# Patient Record
Sex: Female | Born: 1970 | Race: Asian | Hispanic: No | Marital: Married | Smoking: Never smoker
Health system: Southern US, Community
[De-identification: ages and names within clinical notes are randomized; demographics above are authoritative.]

## PROBLEM LIST (undated history)

## (undated) DIAGNOSIS — Z1211 Encounter for screening for malignant neoplasm of colon: Secondary | ICD-10-CM

## (undated) DIAGNOSIS — S83249A Other tear of medial meniscus, current injury, unspecified knee, initial encounter: Secondary | ICD-10-CM

## (undated) DIAGNOSIS — M1711 Unilateral primary osteoarthritis, right knee: Secondary | ICD-10-CM

## (undated) DIAGNOSIS — E282 Polycystic ovarian syndrome: Secondary | ICD-10-CM

## (undated) DIAGNOSIS — D649 Anemia, unspecified: Secondary | ICD-10-CM

## (undated) DIAGNOSIS — T8859XA Other complications of anesthesia, initial encounter: Secondary | ICD-10-CM

## (undated) DIAGNOSIS — H8120 Vestibular neuronitis, unspecified ear: Secondary | ICD-10-CM

## (undated) DIAGNOSIS — E559 Vitamin D deficiency, unspecified: Secondary | ICD-10-CM

## (undated) DIAGNOSIS — G5600 Carpal tunnel syndrome, unspecified upper limb: Secondary | ICD-10-CM

## (undated) DIAGNOSIS — Z87828 Personal history of other (healed) physical injury and trauma: Secondary | ICD-10-CM

## (undated) DIAGNOSIS — E785 Hyperlipidemia, unspecified: Secondary | ICD-10-CM

## (undated) HISTORY — DX: Hyperlipidemia, unspecified: E78.5

## (undated) HISTORY — DX: Vestibular neuronitis, unspecified ear: H81.20

## (undated) HISTORY — DX: Carpal tunnel syndrome, unspecified upper limb: G56.00

## (undated) HISTORY — DX: Other tear of medial meniscus, current injury, unspecified knee, initial encounter: S83.249A

## (undated) HISTORY — DX: Polycystic ovarian syndrome: E28.2

## (undated) HISTORY — DX: Anemia, unspecified: D64.9

## (undated) HISTORY — DX: Unilateral primary osteoarthritis, right knee: M17.11

## (undated) HISTORY — PX: OTHER SURGICAL HISTORY: SHX169

## (undated) HISTORY — DX: Vitamin D deficiency, unspecified: E55.9

## (undated) HISTORY — DX: Personal history of other (healed) physical injury and trauma: Z87.828

## (undated) HISTORY — DX: Encounter for screening for malignant neoplasm of colon: Z12.11

---

## 2004-12-06 ENCOUNTER — Ambulatory Visit: Payer: Self-pay | Admitting: Internal Medicine

## 2004-12-13 ENCOUNTER — Ambulatory Visit: Payer: Self-pay | Admitting: Internal Medicine

## 2004-12-13 ENCOUNTER — Other Ambulatory Visit: Admission: RE | Admit: 2004-12-13 | Discharge: 2004-12-13 | Payer: Self-pay | Admitting: Internal Medicine

## 2005-02-19 ENCOUNTER — Ambulatory Visit: Payer: Self-pay | Admitting: Family Medicine

## 2005-08-24 ENCOUNTER — Ambulatory Visit: Payer: Self-pay | Admitting: Family Medicine

## 2006-04-17 ENCOUNTER — Ambulatory Visit: Payer: Self-pay | Admitting: Internal Medicine

## 2006-12-12 ENCOUNTER — Ambulatory Visit: Payer: Self-pay | Admitting: Internal Medicine

## 2007-03-13 ENCOUNTER — Other Ambulatory Visit: Admission: RE | Admit: 2007-03-13 | Discharge: 2007-03-13 | Payer: Self-pay | Admitting: Internal Medicine

## 2007-03-13 ENCOUNTER — Ambulatory Visit: Payer: Self-pay | Admitting: Internal Medicine

## 2007-03-13 ENCOUNTER — Encounter: Payer: Self-pay | Admitting: Internal Medicine

## 2007-03-13 LAB — CONVERTED CEMR LAB
Albumin: 3.8 g/dL (ref 3.5–5.2)
Basophils Absolute: 0 10*3/uL (ref 0.0–0.1)
Chloride: 109 meq/L (ref 96–112)
Cholesterol: 184 mg/dL (ref 0–200)
Eosinophils Absolute: 0.2 10*3/uL (ref 0.0–0.6)
GFR calc Af Amer: 124 mL/min
GFR calc non Af Amer: 102 mL/min
HCT: 39.7 % (ref 36.0–46.0)
MCHC: 34.7 g/dL (ref 30.0–36.0)
MCV: 85.7 fL (ref 78.0–100.0)
Monocytes Absolute: 0.6 10*3/uL (ref 0.2–0.7)
Neutrophils Relative %: 52.7 % (ref 43.0–77.0)
Potassium: 3.8 meq/L (ref 3.5–5.1)
RBC: 4.63 M/uL (ref 3.87–5.11)
Sodium: 141 meq/L (ref 135–145)
TSH: 1.17 microintl units/mL (ref 0.35–5.50)
Total CHOL/HDL Ratio: 4

## 2007-07-17 ENCOUNTER — Telehealth: Payer: Self-pay | Admitting: *Deleted

## 2007-11-05 ENCOUNTER — Inpatient Hospital Stay (HOSPITAL_COMMUNITY): Admission: AD | Admit: 2007-11-05 | Discharge: 2007-11-05 | Payer: Self-pay | Admitting: Obstetrics and Gynecology

## 2007-12-06 ENCOUNTER — Inpatient Hospital Stay (HOSPITAL_COMMUNITY): Admission: AD | Admit: 2007-12-06 | Discharge: 2007-12-06 | Payer: Self-pay | Admitting: Obstetrics and Gynecology

## 2008-01-14 ENCOUNTER — Telehealth (INDEPENDENT_AMBULATORY_CARE_PROVIDER_SITE_OTHER): Payer: Self-pay | Admitting: *Deleted

## 2008-01-15 DIAGNOSIS — M79609 Pain in unspecified limb: Secondary | ICD-10-CM

## 2008-01-15 DIAGNOSIS — R209 Unspecified disturbances of skin sensation: Secondary | ICD-10-CM | POA: Insufficient documentation

## 2008-01-23 ENCOUNTER — Telehealth: Payer: Self-pay | Admitting: Internal Medicine

## 2008-05-15 ENCOUNTER — Ambulatory Visit: Payer: Self-pay | Admitting: Vascular Surgery

## 2008-05-15 ENCOUNTER — Encounter (INDEPENDENT_AMBULATORY_CARE_PROVIDER_SITE_OTHER): Payer: Self-pay | Admitting: Obstetrics and Gynecology

## 2008-05-15 ENCOUNTER — Ambulatory Visit (HOSPITAL_COMMUNITY): Admission: RE | Admit: 2008-05-15 | Discharge: 2008-05-15 | Payer: Self-pay | Admitting: Obstetrics and Gynecology

## 2008-08-05 ENCOUNTER — Inpatient Hospital Stay (HOSPITAL_COMMUNITY): Admission: AD | Admit: 2008-08-05 | Discharge: 2008-08-05 | Payer: Self-pay | Admitting: Obstetrics and Gynecology

## 2008-08-20 ENCOUNTER — Inpatient Hospital Stay (HOSPITAL_COMMUNITY): Admission: AD | Admit: 2008-08-20 | Discharge: 2008-08-23 | Payer: Self-pay | Admitting: Obstetrics & Gynecology

## 2008-08-20 ENCOUNTER — Inpatient Hospital Stay (HOSPITAL_COMMUNITY): Admission: AD | Admit: 2008-08-20 | Discharge: 2008-08-20 | Payer: Self-pay | Admitting: Obstetrics and Gynecology

## 2008-11-01 ENCOUNTER — Encounter: Admission: RE | Admit: 2008-11-01 | Discharge: 2008-11-01 | Payer: Self-pay | Admitting: Orthopedic Surgery

## 2009-01-28 ENCOUNTER — Telehealth: Payer: Self-pay | Admitting: Internal Medicine

## 2009-04-10 ENCOUNTER — Ambulatory Visit: Payer: Self-pay | Admitting: Internal Medicine

## 2009-04-10 DIAGNOSIS — J309 Allergic rhinitis, unspecified: Secondary | ICD-10-CM | POA: Insufficient documentation

## 2009-04-10 DIAGNOSIS — R42 Dizziness and giddiness: Secondary | ICD-10-CM

## 2009-04-10 LAB — CONVERTED CEMR LAB: Blood Glucose, Fingerstick: 86

## 2009-04-13 LAB — CONVERTED CEMR LAB
ALT: 19 units/L (ref 0–35)
Albumin: 4.2 g/dL (ref 3.5–5.2)
Basophils Absolute: 0 10*3/uL (ref 0.0–0.1)
Bilirubin, Direct: <0.1 mg/dL (ref 0.0–0.3)
CO2: 24 meq/L (ref 19–32)
Cholesterol: 188 mg/dL (ref 0–200)
Glucose, Bld: 86 mg/dL (ref 70–99)
HDL: 44 mg/dL (ref >39)
Hemoglobin: 12.9 g/dL (ref 12.0–15.0)
Lymphocytes Relative: 45 % (ref 12–46)
Monocytes Absolute: 0.7 10*3/uL (ref 0.1–1.0)
Monocytes Relative: 8 % (ref 3–12)
Neutro Abs: 3.9 10*3/uL (ref 1.7–7.7)
Potassium: 4.7 meq/L (ref 3.5–5.3)
RBC: 4.66 M/uL (ref 3.87–5.11)
Sodium: 139 meq/L (ref 135–145)
TSH: 1.296 microintl units/mL (ref 0.350–4.500)
Total CHOL/HDL Ratio: 4.3
VLDL: 38 mg/dL (ref 0–40)
Vitamin B-12: 728 pg/mL (ref 211–911)

## 2010-09-28 LAB — HM PAP SMEAR

## 2011-04-12 NOTE — Discharge Summary (Signed)
Katie Beck, Katie Beck NO.:  [id number]   MEDICAL RECORD NO.:  [id number]          PATIENT TYPE:  INP   LOCATION:  9126                          FACILITY:  [facility]   PHYSICIAN:  Guy Sandifer. Henderson Cloud, M.D. DATE OF BIRTH:  09/26/1971   DATE OF ADMISSION:  08/20/2008  DATE OF DISCHARGE:  08/23/2008                               DISCHARGE SUMMARY   ADMITTING DIAGNOSES:  1. Intrauterine pregnancy at 38-1/2 weeks estimated gestational age.  2. Previous cesarean section with vertical uterine scar.  3. Labor.   DISCHARGE DIAGNOSES:  1. Intrauterine pregnancy at 38-1/2 weeks estimated gestational age.  2. Previous cesarean section with vertical uterine scar.  3. Labor.   PROCEDURE:  On August 20, 2008, is repeat low-transverse cesarean  section.   REASON FOR ADMISSION:  The patient is a 40 year old married female G3,  P1, at 38-1/2 weeks.  Previous delivery was marked with a cesarean  section with vertical extension.  She presents with contractions.   HOSPITAL COURSE:  The patient was admitted to the hospital, undergoes  the above procedure for a viable female infant Apgars of 9 and 9.  Postoperatively, she has vital signs that remained stable and afebrile.  She has a good resumption of bowel function and ambulation.  Hemoglobin  is 11.7.   CONDITION ON DISCHARGE:  Good.  Diet regular as tolerated.  Activity, no  lifting, no operation of automobiles, no vaginal entry.  She is to call  the office for problems including not limited to temperature of 101  degrees, persistent nausea, vomiting, heavy vaginal bleeding, or  increasing pain.  Medications,  1. Percocet 5/325 mg #40 1-2 p.o. q.6 h. P.r.n.  2. Ibuprofen 6 mg q.6 h. p.r.n.  3. Prenatal vitamins daily.   Followup is in the office in 2 weeks.      Guy Sandifer Henderson Cloud, M.D.  Electronically Signed     JET/MEDQ  D:  08/23/2008  T:  08/23/2008  Job:  [id number]

## 2011-04-12 NOTE — Op Note (Signed)
Katie Beck, Katie Beck NO.:  [id number]   MEDICAL RECORD NO.:  [id number]          PATIENT TYPE:  INP   LOCATION:  9126                          FACILITY:  [facility]   PHYSICIAN:  Freddy Finner, M.D.   DATE OF BIRTH:  [birth year]   DATE OF PROCEDURE:  08/20/2008  DATE OF DISCHARGE:                               OPERATIVE REPORT   PREOPERATIVE DIAGNOSES:  1. Intrauterine pregnancy at 38-1/[redacted] weeks gestation.  2. Surgically scarred uterus.  3. Cesarean delivery.  4. In labor.   POSTOPERATIVE DIAGNOSES:  1. Intrauterine pregnancy at 38-1/[redacted] weeks gestation.  2. Surgically scarred uterus.  3. Cesarean delivery.  4. In labor.  5. Subserosal and intramural leiomyomata.   OPERATION/PROCEDURE:  Repeat low transverse cervical cesarean section.   SURGEON:  Freddy Finner, M.D.   ANESTHESIA:  Spinal.   ESTIMATED BLOOD LOSS:  Less than 600 mL.   INTRAOPERATIVE COMPLICATIONS:  None.   INDICATIONS:  The patient is a 40 year old who has had an uneventful  pregnancy and negative beta strep screening who presented in labor. Her  original cesarean delivery was to be October 1.  She had been seen twice  in the MAU on the day of surgery and initially her contractions stopped  with subcutaneous terbutaline.  She presented again this evening with  recurring contractions and documented cervical change.   DESCRIPTION OF PROCEDURE:  She was brought to the operating room.  She  was given an IV bolus of Ancef. She was placed under spinal anesthesia.  She was placed in the dorsal recumbent position with elevation of the  right hip.  The abdomen was prepped and draped in the usual fashion.  Foley catheter was placed using sterile technique.  A lower abdominal  transverse incision was made through an old scar and carried sharply  down to the fascia.  The fascia was entered sharply and extended to the  extent of the skin incision.  Rectus sheath was developed superiorly and  inferiorly  with blunt and sharp dissection.  Rectus muscle was divided  in the midline.  Peritoneum was entered sharply and extended bluntly and  sharply to the extent of the skin incision.  Bladder blade was placed.  Transverse incision was made in the vesicouterine peritoneum overlying  the lower uterine segment.  Trace of fresh meconium was noted in the  fluid.  The infant was then delivered without difficulty.  Bulb  suctioning was accomplished before delivery of the shoulders.  There was  a shoulder cord that reduced as the infant was delivered.  Apgars were 9  and 9, assigned by NICU in attendance.  Cord blood was obtained for  routine venous sampling and for arterial cord blood gases.  Gases are  pending.  Apgars were 9 and 9 by NICU.  The placenta and other products  of conception were removed from the uterus. The uterus was delivered  onto the anterior abdominal wall.  The tubes and ovaries were normal.  Small fibroids were  noted as mentioned above.  After carefully manually  exploring the uterine cavity and all products  of conception being  removed, the uterine incision was closed with double layer of running  locking 0 Monocryls used for the first layer and an imbricating suture  of 0 Monocryl for the second.  Figure-of-eight was required at the right  margin for complete hemostasis.  Bladder flap was reapproximated with an  interrupted 0 Monocryl suture.  Uterus was delivered back into the  abdominal cavity.  Irrigation was carried out.  Hemostasis was again  confirmed to be complete on the lower uterine incision.  All pack,  needle and instrument counts were correct.  Abdominal incision was  closed in layers.  Running 0 Monocryl was used to close the peritoneum  and reapproximate the rectus muscles.  Fascia was closed with a little  looped 0 PDS running from angle to angle on either side.  Subcutaneous  tissue was approximated with running 2-0 plain.  Skin was closed with  wide skin  staples and quarter-inch Steri-Strips.  The patient tolerated  the operative procedure well and was taken to the recovery room in good  condition.      Freddy Finner, M.D.  Electronically Signed     WRN/MEDQ  D:  08/20/2008  T:  08/21/2008  Job:  [id number]

## 2011-08-29 LAB — CBC
HCT: 33.8 — ABNORMAL LOW
MCHC: 33.9
MCV: 89.4
Platelets: 248
Platelets: 313
RBC: 4.53
RDW: 14.2
RDW: 14.2
WBC: 11.1 — ABNORMAL HIGH

## 2011-08-29 LAB — RPR: RPR Ser Ql: NONREACTIVE

## 2011-08-31 LAB — CBC
HCT: 37.9
Hemoglobin: 12.8
MCHC: 33.6
MCV: 89.7
Platelets: 303
RBC: 4.23
RDW: 14
WBC: 9.9

## 2011-08-31 LAB — URINALYSIS, ROUTINE W REFLEX MICROSCOPIC
Bilirubin Urine: NEGATIVE
Glucose, UA: NEGATIVE
Hgb urine dipstick: NEGATIVE
Ketones, ur: NEGATIVE
Nitrite: NEGATIVE
Protein, ur: NEGATIVE
Specific Gravity, Urine: <1.005 — ABNORMAL LOW
Urobilinogen, UA: 0.2
pH: 7

## 2011-09-12 ENCOUNTER — Telehealth: Payer: Self-pay | Admitting: Internal Medicine

## 2011-09-12 NOTE — Telephone Encounter (Signed)
Patient would like to switch from [facility] to [facility] for [facility], is this okay?

## 2011-09-12 NOTE — Telephone Encounter (Signed)
Happy to see her just send a note to previous pmd to make sure that is ok with them

## 2011-09-13 NOTE — Telephone Encounter (Signed)
Ok  With me  To change sites of pcp. wdp.

## 2011-09-13 NOTE — Telephone Encounter (Signed)
Didn't know if you saw this.

## 2011-09-14 NOTE — Telephone Encounter (Signed)
Patient has been scheduled

## 2011-10-07 ENCOUNTER — Ambulatory Visit: Payer: Self-pay | Admitting: Family Medicine

## 2011-10-07 DIAGNOSIS — Z0289 Encounter for other administrative examinations: Secondary | ICD-10-CM

## 2011-11-23 ENCOUNTER — Encounter: Payer: Self-pay | Admitting: Family Medicine

## 2011-11-23 ENCOUNTER — Ambulatory Visit (INDEPENDENT_AMBULATORY_CARE_PROVIDER_SITE_OTHER): Payer: 59 | Admitting: Family Medicine

## 2011-11-23 VITALS — BP 127/84 | HR 106 | Temp 100.4°F | Ht 62.0 in | Wt 125.0 lb

## 2011-11-23 DIAGNOSIS — R6889 Other general symptoms and signs: Secondary | ICD-10-CM

## 2011-11-23 MED ORDER — HYDROCOD POLST-CPM POLST ER 10-8 MG PO CP12: 1.0000 | ORAL_CAPSULE | Freq: Two times a day (BID) | ORAL | Status: DC | PRN

## 2011-11-23 NOTE — Progress Notes (Signed)
OFFICE NOTE  11/23/2011  CC:  Chief Complaint  Patient presents with  . Establish Care    fever, congestion, HA, ST, cough, naueas     HPI: Patient is a 40 y.o. [place] female who is here for cough.  She has been seeing Dr. Fabian Sharp at [facility] but is switching to Dr. Abner Greenspan (seeing me today b/c Dr. Abner Greenspan is out). Pt reports onset of symptoms 36 hours ago.  Primary symptoms are: cough, fever, HA, nasal congestion, PND, ST, body aches.  Most prominent/bothersome symptom(s): f/c.  Symptoms made worse by movement.  Symptoms alleviated by rest..   GI sx's: no, except some nausea Prominent myalgias: yes Prominent fatigue: yes. Flu vaccine received this season at least 2 weeks ago: NO. Recent contact with person with flu-like illness: no.  Others in her family have had mild URI sx's.  ROS:  no rash, no neck stiffness, no shortness of breath, no chest pain    Pertinent PMH:  Past Medical History  Diagnosis Date  . CTS (carpal tunnel syndrome)   . PCO (polycystic ovaries)     conceived on metformin   Past Surgical History  Procedure Date  . Cesarean section 2009 &2002   Past family and social history reviewed and there are no changes since the patient's last office visit.  Pertinent Meds: Ibuprofen, OTC cold/flu formula  PE: Blood pressure 127/84, pulse 106, temperature 100.4 F (38 C), temperature source Temporal, height 5\' 2"  (1.575 m), weight 125 lb (56.7 kg), last menstrual period 11/07/2011, SpO2 99.00%. VS: noted--normal. Gen: alert, NAD, tired but NONTOXIC APPEARING. HEENT: eyes without injection, drainage, or swelling.  Ears: EACs clear, TMs with normal light reflex and landmarks.  Nose: Clear rhinorrhea, with some dried, crusty exudate adherent to mildly injected mucosa.  No purulent d/c.  No paranasal sinus TTP.  No facial swelling.  Throat and mouth without focal lesion.  No pharyngial swelling, erythema, or exudate.   Neck: supple, no LAD.   LUNGS: CTA bilat,  nonlabored resps.   CV: RRR, no m/r/g. EXT: no c/c/e SKIN: no rash    IMPRESSION AND PLAN: Flu-like illness. Tussicaps 1 q12h prn, #20, no RF. Ibuprofen 600-800mg  q6-8h prn fever. Rest, fluids.  Discussed tamiflu option, decided risks outweighed potential benefits for her so decided against this med today. Return when feeling well and we'll give flu vaccine.  FOLLOW UP: prn

## 2011-12-05 ENCOUNTER — Encounter: Payer: Self-pay | Admitting: Family Medicine

## 2011-12-05 ENCOUNTER — Ambulatory Visit (INDEPENDENT_AMBULATORY_CARE_PROVIDER_SITE_OTHER): Payer: 59 | Admitting: Family Medicine

## 2011-12-05 VITALS — BP 112/74 | HR 82 | Temp 98.0°F | Ht 62.0 in | Wt 123.1 lb

## 2011-12-05 DIAGNOSIS — R42 Dizziness and giddiness: Secondary | ICD-10-CM

## 2011-12-05 DIAGNOSIS — B349 Viral infection, unspecified: Secondary | ICD-10-CM

## 2011-12-05 DIAGNOSIS — Z23 Encounter for immunization: Secondary | ICD-10-CM

## 2011-12-05 DIAGNOSIS — B9789 Other viral agents as the cause of diseases classified elsewhere: Secondary | ICD-10-CM

## 2011-12-05 LAB — GLUCOSE, POCT (MANUAL RESULT ENTRY): POC Glucose: 105

## 2011-12-05 MED ORDER — TETANUS-DIPHTH-ACELL PERTUSSIS 5-2.5-18.5 LF-MCG/0.5 IM SUSP: 0.5000 mL | Freq: Once | INTRAMUSCULAR | Status: DC

## 2011-12-05 MED ORDER — METHYLPREDNISOLONE 4 MG PO KIT: PACK | ORAL | Status: AC

## 2011-12-05 NOTE — Patient Instructions (Addendum)
Vertigo Vertigo means you feel like you or your surroundings are moving when they are not. Vertigo can be dangerous if it occurs when you are at work, driving, or performing difficult activities.  CAUSES  Vertigo occurs when there is a conflict of signals sent to your brain from the visual and sensory systems in your body. There are many different causes of vertigo, including:  Infections, especially in the inner ear.   A bad reaction to a drug or misuse of alcohol and medicines.   Withdrawal from drugs or alcohol.   Rapidly changing positions, such as lying down or rolling over in bed.   A migraine headache.   Decreased blood flow to the brain.   Increased pressure in the brain from a head injury, infection, tumor, or bleeding.  SYMPTOMS  You may feel as though the world is spinning around or you are falling to the ground. Because your balance is upset, vertigo can cause nausea and vomiting. You may have involuntary eye movements (nystagmus). DIAGNOSIS  Vertigo is usually diagnosed by physical exam. If the cause of your vertigo is unknown, your caregiver may perform imaging tests, such as an MRI scan (magnetic resonance imaging). TREATMENT  Most cases of vertigo resolve on their own, without treatment. Depending on the cause, your caregiver may prescribe certain medicines. If your vertigo is related to body position issues, your caregiver may recommend movements or procedures to correct the problem. In rare cases, if your vertigo is caused by certain inner ear problems, you may need surgery. HOME CARE INSTRUCTIONS   Follow your caregiver's instructions.   Avoid driving.   Avoid operating heavy machinery.   Avoid performing any tasks that would be dangerous to you or others during a vertigo episode.   Tell your caregiver if you notice that certain medicines seem to be causing your vertigo. Some of the medicines used to treat vertigo episodes can actually make them worse in some  people.  SEEK IMMEDIATE MEDICAL CARE IF:   Your medicines do not relieve your vertigo or are making it worse.   You develop problems with talking, walking, weakness, or using your arms, hands, or legs.   You develop severe headaches.   Your nausea or vomiting continues or gets worse.   You develop visual changes.   A family member notices behavioral changes.   Your condition gets worse.  MAKE SURE YOU:  Understand these instructions.   Will watch your condition.   Will get help right away if you are not doing well or get worse.  Document Released: 08/24/2005 Document Revised: 07/27/2011 Document Reviewed: 06/02/2011 [facility] Patient Information 2012 [place], [place].  Increase hydration and one Gatorade daily for next week Only take medrol dose pak if vertigo worsens again

## 2011-12-11 NOTE — Progress Notes (Signed)
Patient ID: Katie Beck, female   DOB: 01/05/1971, 41 y.o.   MRN: [id number] Katie Beck [id number] 02-05-1971 12/11/2011      Progress Note-Follow Up  Subjective  Chief Complaint  Chief Complaint  Patient presents with  . Injections    flu shot  . Dizziness    X 3 days    HPI  Patient is a 41 year old female who has been struggling with congestion, facial pressure malaise and some pressure in ears. Has been present for the past day she consumes increased vertigo. She sustained sensation when she moves quickly. No nausea vomiting. No headache or other complaints. No chest pain, palpitations shortness of breath, syncope, GI or GU complaints noted.  Past Medical History  Diagnosis Date  . 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 Hyperlipidemia   . CTS (carpal tunnel syndrome)     Past Surgical History  Procedure Date  . Cesarean section 2009 &2002    Family History  Problem Relation Age of Onset  . Diabetes Father   . Hypertension Father   . Hypertension Sister   . Diabetes Sister     History   Social History  . Marital Status: Married    Spouse Name: N/A    Number of Children: N/A  . Years of Education: N/A   Occupational History  . Not on file.   Social History Main Topics  . Smoking status: Never Smoker   . Smokeless tobacco: Never Used  . Alcohol Use: No  . Drug Use: No  . Sexually Active: Not on file   Other Topics Concern  . 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      pets no     NO ets or tobacco.      Vegetarian   Smoking Status:  never   Caffeine use/day:  3-4   Does Patient Exercise:  no    No current outpatient prescriptions on file prior to visit.   No current facility-administered medications on file prior to visit.    No Known Allergies  Review of Systems  Review of Systems    Constitutional: Negative for fever and malaise/fatigue.  HENT: Negative for congestion.   Eyes: Negative for discharge.  Respiratory: Negative for shortness of breath.   Cardiovascular: Negative for chest pain, palpitations and leg swelling.  Gastrointestinal: Negative for nausea, abdominal pain and diarrhea.  Genitourinary: Negative for dysuria.  Musculoskeletal: Negative for falls.  Skin: Negative for rash.  Neurological: Positive for dizziness. Negative for sensory change, speech change, seizures, loss of consciousness and headaches.  Endo/Heme/Allergies: Negative for polydipsia.  Psychiatric/Behavioral: Negative for depression and suicidal ideas. The patient is not nervous/anxious and does not have insomnia.     Objective  BP 112/74  Pulse 82  Temp(Src) 98 F (36.7 C) (Temporal)  Ht 5\' 2"  (1.575 m)  Wt 123 lb 1.9 oz (55.847 kg)  BMI 22.52 kg/m2  SpO2 100%  LMP 12/05/2011  Physical Exam  Physical Exam  Constitutional: She is oriented to person, place, and time and well-developed, well-nourished, and in no distress. No distress.  HENT:  Head: Normocephalic and atraumatic.  Eyes: Conjunctivae are normal.  Neck: Neck supple. No thyromegaly present.  Cardiovascular: Normal rate, regular rhythm and normal heart sounds.   No murmur heard.  Pulmonary/Chest: Effort normal and breath sounds normal. She has no wheezes.  Abdominal: She exhibits no distension and no mass.  Musculoskeletal: She exhibits no edema.  Lymphadenopathy:    She has no cervical adenopathy.  Neurological: She is alert and oriented to person, place, and time.  Skin: Skin is warm and dry. No rash noted. She is not diaphoretic.  Psychiatric: Memory, affect and judgment normal.    Lab Results  Component Value Date   TSH 1.296 04/10/2009   Lab Results  Component Value Date   WBC 8.9 04/10/2009   HGB 12.9 04/10/2009   HCT 37.1 04/10/2009   MCV 79.6 04/10/2009   PLT 346 04/10/2009   Lab Results  Component  Value Date   CREATININE 0.70 04/10/2009   BUN 19 04/10/2009   NA 139 04/10/2009   K 4.7 04/10/2009   CL 106 04/10/2009   CO2 24 04/10/2009   Lab Results  Component Value Date   ALT 19 04/10/2009   AST 14 04/10/2009   ALKPHOS 88 04/10/2009   BILITOT 0.2* 04/10/2009   Lab Results  Component Value Date   CHOL 188 04/10/2009   Lab Results  Component Value Date   HDL 44 04/10/2009   Lab Results  Component Value Date   LDLCALC 106* 04/10/2009   Lab Results  Component Value Date   TRIG 191* 04/10/2009   Lab Results  Component Value Date   CHOLHDL 4.3 Ratio 04/10/2009     Assessment & Plan   VERTIGO Has suffered a recent viral illness that has resulted in ome inner ear dysfunction and vertigo, she is ge-iven a Medrol dose pack and asked to hydrate well and report if symptoms worsen or do not resolve

## 2011-12-16 NOTE — Assessment & Plan Note (Signed)
Has suffered a recent viral illness that has resulted in ome inner ear dysfunction and vertigo, she is ge-iven a Medrol dose pack and asked to hydrate well and report if symptoms worsen or do not resolve

## 2012-10-30 ENCOUNTER — Encounter: Payer: Self-pay | Admitting: Family Medicine

## 2012-10-30 ENCOUNTER — Ambulatory Visit (INDEPENDENT_AMBULATORY_CARE_PROVIDER_SITE_OTHER): Payer: 59 | Admitting: Family Medicine

## 2012-10-30 VITALS — BP 107/70 | HR 71 | Temp 98.2°F | Ht 62.0 in | Wt 128.0 lb

## 2012-10-30 DIAGNOSIS — E559 Vitamin D deficiency, unspecified: Secondary | ICD-10-CM

## 2012-10-30 DIAGNOSIS — Z0000 Encounter for general adult medical examination without abnormal findings: Secondary | ICD-10-CM

## 2012-10-30 DIAGNOSIS — D649 Anemia, unspecified: Secondary | ICD-10-CM | POA: Insufficient documentation

## 2012-10-30 DIAGNOSIS — E785 Hyperlipidemia, unspecified: Secondary | ICD-10-CM

## 2012-10-30 HISTORY — DX: Vitamin D deficiency, unspecified: E55.9

## 2012-10-30 HISTORY — DX: Anemia, unspecified: D64.9

## 2012-10-30 NOTE — Patient Instructions (Addendum)
Consider switching to MegaRed krill oil caps daily  Preventive Care for Adults, Female A healthy lifestyle and preventive care can promote health and wellness. Preventive health guidelines for women include the following key practices.  A routine yearly physical is a good way to check with your caregiver about your health and preventive screening. It is a chance to share any concerns and updates on your health, and to receive a thorough exam.  Visit your dentist for a routine exam and preventive care every 6 months. Brush your teeth twice a day and floss once a day. Good oral hygiene prevents tooth decay and gum disease.  The frequency of eye exams is based on your age, health, family medical history, use of contact lenses, and other factors. Follow your caregiver's recommendations for frequency of eye exams.  Eat a healthy diet. Foods like vegetables, fruits, whole grains, low-fat dairy products, and lean protein foods contain the nutrients you need without too many calories. Decrease your intake of foods high in solid fats, added sugars, and salt. Eat the right amount of calories for you.Get information about a proper diet from your caregiver, if necessary.  Regular physical exercise is one of the most important things you can do for your health. Most adults should get at least 150 minutes of moderate-intensity exercise (any activity that increases your heart rate and causes you to sweat) each week. In addition, most adults need muscle-strengthening exercises on 2 or more days a week.  Maintain a healthy weight. The body mass index (BMI) is a screening tool to identify possible weight problems. It provides an estimate of body fat based on height and weight. Your caregiver can help determine your BMI, and can help you achieve or maintain a healthy weight.For adults 20 years and older:  A BMI below 18.5 is considered underweight.  A BMI of 18.5 to 24.9 is normal.  A BMI of 25 to 29.9 is  considered overweight.  A BMI of 30 and above is considered obese.  Maintain normal blood lipids and cholesterol levels by exercising and minimizing your intake of saturated fat. Eat a balanced diet with plenty of fruit and vegetables. Blood tests for lipids and cholesterol should begin at age 53 and be repeated every 5 years. If your lipid or cholesterol levels are high, you are over 50, or you are at high risk for heart disease, you may need your cholesterol levels checked more frequently.Ongoing high lipid and cholesterol levels should be treated with medicines if diet and exercise are not effective.  If you smoke, find out from your caregiver how to quit. If you do not use tobacco, do not start.  If you are pregnant, do not drink alcohol. If you are breastfeeding, be very cautious about drinking alcohol. If you are not pregnant and choose to drink alcohol, do not exceed 1 drink per day. One drink is considered to be 12 ounces (355 mL) of beer, 5 ounces (148 mL) of wine, or 1.5 ounces (44 mL) of liquor.  Avoid use of street drugs. Do not share needles with anyone. Ask for help if you need support or instructions about stopping the use of drugs.  High blood pressure causes heart disease and increases the risk of stroke. Your blood pressure should be checked at least every 1 to 2 years. Ongoing high blood pressure should be treated with medicines if weight loss and exercise are not effective.  If you are 48 to 39 years old, ask your caregiver if  you should take aspirin to prevent strokes.  Diabetes screening involves taking a blood sample to check your fasting blood sugar level. This should be done once every 3 years, after age 29, if you are within normal weight and without risk factors for diabetes. Testing should be considered at a younger age or be carried out more frequently if you are overweight and have at least 1 risk factor for diabetes.  Breast cancer screening is essential preventive  care for women. You should practice "breast self-awareness." This means understanding the normal appearance and feel of your breasts and may include breast self-examination. Any changes detected, no matter how small, should be reported to a caregiver. Women in their 42s and 30s should have a clinical breast exam (CBE) by a caregiver as part of a regular health exam every 1 to 3 years. After age 55, women should have a CBE every year. Starting at age 23, women should consider having a mammography (breast X-ray test) every year. Women who have a family history of breast cancer should talk to their caregiver about genetic screening. Women at a high risk of breast cancer should talk to their caregivers about having magnetic resonance imaging (MRI) and a mammography every year.  The Pap test is a screening test for cervical cancer. A Pap test can show cell changes on the cervix that might become cervical cancer if left untreated. A Pap test is a procedure in which cells are obtained and examined from the lower end of the uterus (cervix).  Women should have a Pap test starting at age 50.  Between ages 3 and 56, Pap tests should be repeated every 2 years.  Beginning at age 59, you should have a Pap test every 3 years as long as the past 3 Pap tests have been normal.  Some women have medical problems that increase the chance of getting cervical cancer. Talk to your caregiver about these problems. It is especially important to talk to your caregiver if a new problem develops soon after your last Pap test. In these cases, your caregiver may recommend more frequent screening and Pap tests.  The above recommendations are the same for women who have or have not gotten the vaccine for human papillomavirus (HPV).  If you had a hysterectomy for a problem that was not cancer or a condition that could lead to cancer, then you no longer need Pap tests. Even if you no longer need a Pap test, a regular exam is a good idea  to make sure no other problems are starting.  If you are between ages 36 and 72, and you have had normal Pap tests going back 10 years, you no longer need Pap tests. Even if you no longer need a Pap test, a regular exam is a good idea to make sure no other problems are starting.  If you have had past treatment for cervical cancer or a condition that could lead to cancer, you need Pap tests and screening for cancer for at least 20 years after your treatment.  If Pap tests have been discontinued, risk factors (such as a new sexual partner) need to be reassessed to determine if screening should be resumed.  The HPV test is an additional test that may be used for cervical cancer screening. The HPV test looks for the virus that can cause the cell changes on the cervix. The cells collected during the Pap test can be tested for HPV. The HPV test could be used to  screen women aged 73 years and older, and should be used in women of any age who have unclear Pap test results. After the age of 29, women should have HPV testing at the same frequency as a Pap test.  Colorectal cancer can be detected and often prevented. Most routine colorectal cancer screening begins at the age of 64 and continues through age 2. However, your caregiver may recommend screening at an earlier age if you have risk factors for colon cancer. On a yearly basis, your caregiver may provide home test kits to check for hidden blood in the stool. Use of a small camera at the end of a tube, to directly examine the colon (sigmoidoscopy or colonoscopy), can detect the earliest forms of colorectal cancer. Talk to your caregiver about this at age 76, when routine screening begins. Direct examination of the colon should be repeated every 5 to 10 years through age 26, unless early forms of pre-cancerous polyps or small growths are found.  Hepatitis C blood testing is recommended for all people born from [birth year] through 1965 and any individual with known  risks for hepatitis C.  Practice safe sex. Use condoms and avoid high-risk sexual practices to reduce the spread of sexually transmitted infections (STIs). STIs include gonorrhea, chlamydia, syphilis, trichomonas, herpes, HPV, and human immunodeficiency virus (HIV). Herpes, HIV, and HPV are viral illnesses that have no cure. They can result in disability, cancer, and death. Sexually active women aged 31 and younger should be checked for chlamydia. Older women with new or multiple partners should also be tested for chlamydia. Testing for other STIs is recommended if you are sexually active and at increased risk.  Osteoporosis is a disease in which the bones lose minerals and strength with aging. This can result in serious bone fractures. The risk of osteoporosis can be identified using a bone density scan. Women ages 60 and over and women at risk for fractures or osteoporosis should discuss screening with their caregivers. Ask your caregiver whether you should take a calcium supplement or vitamin D to reduce the rate of osteoporosis.  Menopause can be associated with physical symptoms and risks. Hormone replacement therapy is available to decrease symptoms and risks. You should talk to your caregiver about whether hormone replacement therapy is right for you.  Use sunscreen with sun protection factor (SPF) of 30 or more. Apply sunscreen liberally and repeatedly throughout the day. You should seek shade when your shadow is shorter than you. Protect yourself by wearing long sleeves, pants, a wide-brimmed hat, and sunglasses year round, whenever you are outdoors.  Once a month, do a whole body skin exam, using a mirror to look at the skin on your back. Notify your caregiver of new moles, moles that have irregular borders, moles that are larger than a pencil eraser, or moles that have changed in shape or color.  Stay current with required immunizations.  Influenza. You need a dose every fall (or winter).  The composition of the flu vaccine changes each year, so being vaccinated once is not enough.  Pneumococcal polysaccharide. You need 1 to 2 doses if you smoke cigarettes or if you have certain chronic medical conditions. You need 1 dose at age 68 (or older) if you have never been vaccinated.  Tetanus, diphtheria, pertussis (Tdap, Td). Get 1 dose of Tdap vaccine if you are younger than age 70, are over 64 and have contact with an infant, are a Research scientist (physical sciences), are pregnant, or simply want to be protected  from whooping cough. After that, you need a Td booster dose every 10 years. Consult your caregiver if you have not had at least 3 tetanus and diphtheria-containing shots sometime in your life or have a deep or dirty wound.  HPV. You need this vaccine if you are a woman age 53 or younger. The vaccine is given in 3 doses over 6 months.  Measles, mumps, rubella (MMR). You need at least 1 dose of MMR if you were born in 1957 or later. You may also need a second dose.  Meningococcal. If you are age 67 to 25 and a first-year college student living in a residence hall, or have one of several medical conditions, you need to get vaccinated against meningococcal disease. You may also need additional booster doses.  Zoster (shingles). If you are age 79 or older, you should get this vaccine.  Varicella (chickenpox). If you have never had chickenpox or you were vaccinated but received only 1 dose, talk to your caregiver to find out if you need this vaccine.  Hepatitis A. You need this vaccine if you have a specific risk factor for hepatitis A virus infection or you simply wish to be protected from this disease. The vaccine is usually given as 2 doses, 6 to 18 months apart.  Hepatitis B. You need this vaccine if you have a specific risk factor for hepatitis B virus infection or you simply wish to be protected from this disease. The vaccine is given in 3 doses, usually over 6 months. Preventive Services /  Frequency Ages 25 to 61  Blood pressure check.** / Every 1 to 2 years.  Lipid and cholesterol check.** / Every 5 years beginning at age 72.  Clinical breast exam.** / Every 3 years for women in their 51s and 30s.  Pap test.** / Every 2 years from ages 45 through 13. Every 3 years starting at age 5 through age 68 or 40 with a history of 3 consecutive normal Pap tests.  HPV screening.** / Every 3 years from ages 59 through ages 18 to 57 with a history of 3 consecutive normal Pap tests.  Hepatitis C blood test.** / For any individual with known risks for hepatitis C.  Skin self-exam. / Monthly.  Influenza immunization.** / Every year.  Pneumococcal polysaccharide immunization.** / 1 to 2 doses if you smoke cigarettes or if you have certain chronic medical conditions.  Tetanus, diphtheria, pertussis (Tdap, Td) immunization. / A one-time dose of Tdap vaccine. After that, you need a Td booster dose every 10 years.  HPV immunization. / 3 doses over 6 months, if you are 101 and younger.  Measles, mumps, rubella (MMR) immunization. / You need at least 1 dose of MMR if you were born in 1957 or later. You may also need a second dose.  Meningococcal immunization. / 1 dose if you are age 63 to 84 and a first-year college student living in a residence hall, or have one of several medical conditions, you need to get vaccinated against meningococcal disease. You may also need additional booster doses.  Varicella immunization.** / Consult your caregiver.  Hepatitis A immunization.** / Consult your caregiver. 2 doses, 6 to 18 months apart.  Hepatitis B immunization.** / Consult your caregiver. 3 doses usually over 6 months. Ages 39 to 4  Blood pressure check.** / Every 1 to 2 years.  Lipid and cholesterol check.** / Every 5 years beginning at age 95.  Clinical breast exam.** / Every year after age 68.  Mammogram.** / Every year beginning at age 16 and continuing for as long as you are in  good health. Consult with your caregiver.  Pap test.** / Every 3 years starting at age 50 through age 26 or 3 with a history of 3 consecutive normal Pap tests.  HPV screening.** / Every 3 years from ages 17 through ages 53 to 15 with a history of 3 consecutive normal Pap tests.  Fecal occult blood test (FOBT) of stool. / Every year beginning at age 73 and continuing until age 49. You may not need to do this test if you get a colonoscopy every 10 years.  Flexible sigmoidoscopy or colonoscopy.** / Every 5 years for a flexible sigmoidoscopy or every 10 years for a colonoscopy beginning at age 96 and continuing until age 16.  Hepatitis C blood test.** / For all people born from [birth year] through 1965 and any individual with known risks for hepatitis C.  Skin self-exam. / Monthly.  Influenza immunization.** / Every year.  Pneumococcal polysaccharide immunization.** / 1 to 2 doses if you smoke cigarettes or if you have certain chronic medical conditions.  Tetanus, diphtheria, pertussis (Tdap, Td) immunization.** / A one-time dose of Tdap vaccine. After that, you need a Td booster dose every 10 years.  Measles, mumps, rubella (MMR) immunization. / You need at least 1 dose of MMR if you were born in 1957 or later. You may also need a second dose.  Varicella immunization.** / Consult your caregiver.  Meningococcal immunization.** / Consult your caregiver.  Hepatitis A immunization.** / Consult your caregiver. 2 doses, 6 to 18 months apart.  Hepatitis B immunization.** / Consult your caregiver. 3 doses, usually over 6 months. Ages 9 and over  Blood pressure check.** / Every 1 to 2 years.  Lipid and cholesterol check.** / Every 5 years beginning at age 38.  Clinical breast exam.** / Every year after age 69.  Mammogram.** / Every year beginning at age 20 and continuing for as long as you are in good health. Consult with your caregiver.  Pap test.** / Every 3 years starting at age 76 through  age 65 or 50 with a 3 consecutive normal Pap tests. Testing can be stopped between 65 and 70 with 3 consecutive normal Pap tests and no abnormal Pap or HPV tests in the past 10 years.  HPV screening.** / Every 3 years from ages 13 through ages 48 or 13 with a history of 3 consecutive normal Pap tests. Testing can be stopped between 65 and 70 with 3 consecutive normal Pap tests and no abnormal Pap or HPV tests in the past 10 years.  Fecal occult blood test (FOBT) of stool. / Every year beginning at age 66 and continuing until age 41. You may not need to do this test if you get a colonoscopy every 10 years.  Flexible sigmoidoscopy or colonoscopy.** / Every 5 years for a flexible sigmoidoscopy or every 10 years for a colonoscopy beginning at age 75 and continuing until age 16.  Hepatitis C blood test.** / For all people born from [birth year] through 1965 and any individual with known risks for hepatitis C.  Osteoporosis screening.** / A one-time screening for women ages 76 and over and women at risk for fractures or osteoporosis.  Skin self-exam. / Monthly.  Influenza immunization.** / Every year.  Pneumococcal polysaccharide immunization.** / 1 dose at age 39 (or older) if you have never been vaccinated.  Tetanus, diphtheria, pertussis (Tdap, Td) immunization. / A one-time  dose of Tdap vaccine if you are over 65 and have contact with an infant, are a Research scientist (physical sciences), or simply want to be protected from whooping cough. After that, you need a Td booster dose every 10 years.  Varicella immunization.** / Consult your caregiver.  Meningococcal immunization.** / Consult your caregiver.  Hepatitis A immunization.** / Consult your caregiver. 2 doses, 6 to 18 months apart.  Hepatitis B immunization.** / Check with your caregiver. 3 doses, usually over 6 months. ** Family history and personal history of risk and conditions may change your caregiver's recommendations. Document Released: 01/10/2002 Document  Revised: 02/06/2012 Document Reviewed: 04/11/2011 [facility] Patient Information 2013 [place], [place].

## 2012-10-31 ENCOUNTER — Encounter: Payer: Self-pay | Admitting: Family Medicine

## 2012-10-31 DIAGNOSIS — E785 Hyperlipidemia, unspecified: Secondary | ICD-10-CM | POA: Insufficient documentation

## 2012-10-31 DIAGNOSIS — E7800 Pure hypercholesterolemia, unspecified: Secondary | ICD-10-CM | POA: Insufficient documentation

## 2012-10-31 DIAGNOSIS — Z0000 Encounter for general adult medical examination without abnormal findings: Secondary | ICD-10-CM | POA: Insufficient documentation

## 2012-10-31 NOTE — Assessment & Plan Note (Signed)
Mild in past, avoid trans fats, increase exercise, start MegaRed krill oil caps and check lipid panel

## 2012-10-31 NOTE — Assessment & Plan Note (Signed)
Will check levels, not taking any supplements at this time

## 2012-10-31 NOTE — Progress Notes (Signed)
Patient ID: Katie Beck, female   DOB: 1971/09/15, 41 y.o.   MRN: [id number] Katie Beck [id number] 1970/12/28 10/31/2012      Progress Note New Patient  Subjective  Chief Complaint  Chief Complaint  Patient presents with  . Annual Exam    breast exam, no pap; no problems    HPI  Patient is a 41 year old [place] female in today for annual exam. She is offering no acute complaints. Reports her health has been good. Flu shot in November. She follows a vegetarian diet with eggs and dairy. She is not offering any GYN complaints and is up-to-date on her Pap smear. Offers no breast complaints and has no history of having had mammograms or any lesions. Her allergies are ongoing but tolerable and mild. No fevers, chills, headache, chest pain, palpitations, shortness or breath or other concerns noted today. No GI or GU complaints  Past Medical History  Diagnosis Date  . 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 Hyperlipidemia   . CTS (carpal tunnel syndrome)   . Vitamin D deficiency 10/30/2012  . Anemia 10/30/2012  . Preventative health care 10/31/2012  . Hyperlipidemia 10/31/2012    Past Surgical History  Procedure Date  . Cesarean section 2009 &2002    Family History  Problem Relation Age of Onset  . Diabetes Father   . Hypertension Father   . Hypertension Sister   . Diabetes Sister   . Hypertension Mother   . Diabetes Mother   . Hyperlipidemia Mother   . Diabetes Maternal Grandfather     History   Social History  . Marital Status: Married    Spouse Name: N/A    Number of Children: N/A  . Years of Education: N/A   Occupational History  . Not on file.   Social History Main Topics  . Smoking status: Never Smoker   . Smokeless tobacco: Never Used  . Alcohol Use: No  . Drug Use: No  . Sexually Active: Yes   Other Topics Concern  . Not on file   Social History Narrative   [place] country of origin     works outside Hospital doctor level education   HH of 4 (39 y/o, 39 y/o + married).      pets no     NO ets or tobacco.      Vegetarian   Smoking Status:  never   Caffeine use/day:  3-4   Does Patient Exercise:  no    No current outpatient prescriptions on file prior to visit.    No Known Allergies  Review of Systems  Review of Systems  Constitutional: Negative for fever and malaise/fatigue.  HENT: Negative for congestion.   Eyes: Negative for discharge.  Respiratory: Negative for shortness of breath.   Cardiovascular: Negative for chest pain, palpitations and leg swelling.  Gastrointestinal: Negative for nausea, abdominal pain and diarrhea.  Genitourinary: Negative for dysuria.  Musculoskeletal: Negative for falls.  Skin: Negative for rash.  Neurological: Negative for loss of consciousness and headaches.  Endo/Heme/Allergies: Negative for polydipsia.  Psychiatric/Behavioral: Negative for depression and suicidal ideas. The patient is not nervous/anxious and does not have insomnia.     Objective  BP 107/70  Pulse 71  Temp 98.2 F (36.8 C) (Temporal)  Ht 5\' 2"  (1.575 m)  Wt 128 lb (58.06 kg)  BMI 23.41 kg/m2  SpO2 99%  LMP 10/07/2012  Physical Exam  Physical Exam  Constitutional: She  is oriented to person, place, and time and well-developed, well-nourished, and in no distress. No distress.  HENT:  Head: Normocephalic and atraumatic.  Right Ear: External ear normal.  Left Ear: External ear normal.  Nose: Nose normal.  Mouth/Throat: Oropharynx is clear and moist. No oropharyngeal exudate.  Eyes: Conjunctivae normal are normal. Pupils are equal, round, and reactive to light. Right eye exhibits no discharge. Left eye exhibits no discharge. No scleral icterus.  Neck: Normal range of motion. Neck supple. No thyromegaly present.  Cardiovascular: Normal rate, regular rhythm, normal heart sounds and intact distal pulses.   No murmur heard. Pulmonary/Chest: Effort normal and breath sounds normal. No  respiratory distress. She has no wheezes. She has no rales.  Abdominal: Soft. Bowel sounds are normal. She exhibits no distension and no mass. There is no tenderness.  Genitourinary:       Breast exam without masses, discharge or skin changes b/l  Musculoskeletal: Normal range of motion. She exhibits no edema and no tenderness.  Lymphadenopathy:    She has no cervical adenopathy.  Neurological: She is alert and oriented to person, place, and time. She has normal reflexes. No cranial nerve deficit. Coordination normal.  Skin: Skin is warm and dry. No rash noted. She is not diaphoretic.  Psychiatric: Mood, memory and affect normal.       Assessment & Plan  Preventative health care Breast exam today unremarkable, start baseline MGM next year or sooner as needed. UTD on immunizations including flu shot. Follows a heart healthy vegetarian diet with eggs and dairy. Encouraged adequate sleep and exercise and annual labs will be performed, fasting in 2 days time  Vitamin D deficiency Will check levels, not taking any supplements at this time  Anemia Resolved with last blood draw, will repeat CBC  Hyperlipidemia Mild in past, avoid trans fats, increase exercise, start MegaRed krill oil caps and check lipid panel

## 2012-10-31 NOTE — Assessment & Plan Note (Signed)
Breast exam today unremarkable, start baseline MGM next year or sooner as needed. UTD on immunizations including flu shot. Follows a heart healthy vegetarian diet with eggs and dairy. Encouraged adequate sleep and exercise and annual labs will be performed, fasting in 2 days time

## 2012-10-31 NOTE — Assessment & Plan Note (Signed)
Resolved with last blood draw, will repeat CBC

## 2013-03-27 ENCOUNTER — Ambulatory Visit (INDEPENDENT_AMBULATORY_CARE_PROVIDER_SITE_OTHER): Payer: 59 | Admitting: Nurse Practitioner

## 2013-03-27 ENCOUNTER — Encounter: Payer: Self-pay | Admitting: Nurse Practitioner

## 2013-03-27 VITALS — BP 121/74 | HR 69 | Temp 97.9°F | Ht 62.0 in | Wt 127.1 lb

## 2013-03-27 DIAGNOSIS — R300 Dysuria: Secondary | ICD-10-CM

## 2013-03-27 DIAGNOSIS — N390 Urinary tract infection, site not specified: Secondary | ICD-10-CM

## 2013-03-27 LAB — POCT URINALYSIS DIPSTICK
Bilirubin, UA: NEGATIVE
Glucose, UA: 100
Leukocytes, UA: NEGATIVE
Nitrite, UA: POSITIVE
Urobilinogen, UA: 2
pH, UA: 6.5

## 2013-03-27 MED ORDER — NITROFURANTOIN MONOHYD MACRO 100 MG PO CAPS: 100.0000 mg | ORAL_CAPSULE | Freq: Two times a day (BID) | ORAL | Status: DC

## 2013-03-27 NOTE — Addendum Note (Signed)
Addended by: Baldemar Lenis R on: 03/27/2013 03:48 PM   Modules accepted: Orders

## 2013-03-27 NOTE — Progress Notes (Signed)
  Subjective:    Patient ID: Katie Beck, female    DOB: [birth year], 42 y.o.   MRN: [id number]  HPI Comments: Reports monogamous relationship. States never had UTI in past.  Urinary Tract Infection  This is a new problem. The current episode started in the past 7 days. The problem occurs every urination. The problem has been gradually worsening. The quality of the pain is described as burning. The pain is at a severity of 4/10. There has been no fever. She is sexually active. There is no history of pyelonephritis. Associated symptoms include frequency and urgency. Pertinent negatives include no chills, flank pain, hematuria, nausea or vomiting. Treatments tried: AZO has relieved urgency. The treatment provided mild relief. There is no history of kidney stones, recurrent UTIs or a urological procedure.      Review of Systems  Constitutional: Positive for fatigue. Negative for fever, chills and appetite change.  Gastrointestinal: Positive for abdominal pain (suprapubic pain). Negative for nausea, vomiting, diarrhea and constipation.  Genitourinary: Positive for dysuria, urgency and frequency. Negative for hematuria, flank pain, vaginal discharge, genital sores, vaginal pain and dyspareunia.  Musculoskeletal: Negative for arthralgias.  Neurological: Negative for headaches.       Objective:   Physical Exam  Vitals reviewed. Constitutional: She is oriented to person, place, and time. She appears well-developed and well-nourished. No distress.  HENT:  Head: Normocephalic and atraumatic.  Eyes: Conjunctivae are normal.  Abdominal: She exhibits no distension and no mass. There is tenderness (suprapubic tenderness to palpation). There is no rebound and no guarding.  Musculoskeletal: She exhibits tenderness (R CVA tenderness to palpation).  Neurological: She is alert and oriented to person, place, and time.  Skin: Skin is warm and dry.  Psychiatric: She has a normal mood and affect. Her  behavior is normal. Thought content normal.          Assessment & Plan:  1) UTI: macrodantin. Increase fluids. Vit C 500 mg twice daily to acidify urine, creating bacteriostatic environment. AZO for urgency. Return if no improvement in 4 days.

## 2013-03-27 NOTE — Patient Instructions (Signed)
Urinary Tract Infection Urinary tract infections (UTIs) can develop anywhere along your urinary tract. Your urinary tract is your body's drainage system for removing wastes and extra water. Your urinary tract includes two kidneys, two ureters, a bladder, and a urethra. Your kidneys are a pair of bean-shaped organs. Each kidney is about the size of your fist. They are located below your ribs, one on each side of your spine. CAUSES Infections are caused by microbes, which are microscopic organisms, including fungi, viruses, and bacteria. These organisms are so small that they can only be seen through a microscope. Bacteria are the microbes that most commonly cause UTIs. SYMPTOMS  Symptoms of UTIs may vary by age and gender of the patient and by the location of the infection. Symptoms in young women typically include a frequent and intense urge to urinate and a painful, burning feeling in the bladder or urethra during urination. Older women and men are more likely to be tired, shaky, and weak and have muscle aches and abdominal pain. A fever may mean the infection is in your kidneys. Other symptoms of a kidney infection include pain in your back or sides below the ribs, nausea, and vomiting. DIAGNOSIS To diagnose a UTI, your caregiver will ask you about your symptoms. Your caregiver also will ask to provide a urine sample. The urine sample will be tested for bacteria and white blood cells. White blood cells are made by your body to help fight infection. TREATMENT  Typically, UTIs can be treated with medication. Because most UTIs are caused by a bacterial infection, they usually can be treated with the use of antibiotics. The choice of antibiotic and length of treatment depend on your symptoms and the type of bacteria causing your infection. HOME CARE INSTRUCTIONS  If you were prescribed antibiotics, take them exactly as your caregiver instructs you. Finish the medication even if you feel better after you  have only taken some of the medication.  Drink enough water and fluids to keep your urine clear or pale yellow.  Avoid caffeine, tea, and carbonated beverages. They tend to irritate your bladder.  Empty your bladder often. Avoid holding urine for long periods of time.  Empty your bladder before and after sexual intercourse.  After a bowel movement, women should cleanse from front to back. Use each tissue only once. SEEK MEDICAL CARE IF:   You have back pain.  You develop a fever.  Your symptoms do not begin to resolve within 3 days. SEEK IMMEDIATE MEDICAL CARE IF:   You have severe back pain or lower abdominal pain.  You develop chills.  You have nausea or vomiting.  You have continued burning or discomfort with urination. MAKE SURE YOU:   Understand these instructions.  Will watch your condition.  Will get help right away if you are not doing well or get worse. Document Released: 08/24/2005 Document Revised: 05/15/2012 Document Reviewed: 12/23/2011 [employer] Patient Information 2013 [employer].  

## 2013-03-30 LAB — URINE CULTURE: Colony Count: 10000

## 2013-11-12 ENCOUNTER — Other Ambulatory Visit: Payer: Self-pay | Admitting: Family Medicine

## 2013-11-12 ENCOUNTER — Encounter: Payer: Self-pay | Admitting: Family Medicine

## 2013-11-13 ENCOUNTER — Other Ambulatory Visit (INDEPENDENT_AMBULATORY_CARE_PROVIDER_SITE_OTHER): Payer: 59

## 2013-11-13 DIAGNOSIS — Z0000 Encounter for general adult medical examination without abnormal findings: Secondary | ICD-10-CM

## 2013-11-13 LAB — CBC WITH DIFFERENTIAL/PLATELET
Basophils Absolute: 0 10*3/uL (ref 0.0–0.1)
Eosinophils Absolute: 0.1 10*3/uL (ref 0.0–0.7)
HCT: 40.5 % (ref 36.0–46.0)
Hemoglobin: 13.6 g/dL (ref 12.0–15.0)
Lymphs Abs: 2.8 10*3/uL (ref 0.7–4.0)
MCHC: 33.7 g/dL (ref 30.0–36.0)
Monocytes Relative: 8.3 % (ref 3.0–12.0)
Neutro Abs: 2 10*3/uL (ref 1.4–7.7)
Platelets: 313 10*3/uL (ref 150.0–400.0)
RDW: 12.7 % (ref 11.5–14.6)

## 2013-11-13 LAB — LIPID PANEL
HDL: 45.7 mg/dL (ref >39.00)
LDL Cholesterol: 111 mg/dL — ABNORMAL HIGH (ref 0–99)
Total CHOL/HDL Ratio: 4
Triglycerides: 96 mg/dL (ref 0.0–149.0)
VLDL: 19.2 mg/dL (ref 0.0–40.0)

## 2013-11-13 LAB — COMPREHENSIVE METABOLIC PANEL
ALT: 25 U/L (ref 0–35)
AST: 18 U/L (ref 0–37)
Alkaline Phosphatase: 61 U/L (ref 39–117)
Creatinine, Ser: 0.8 mg/dL (ref 0.4–1.2)
Sodium: 141 mEq/L (ref 135–145)
Total Bilirubin: 0.2 mg/dL — ABNORMAL LOW (ref 0.3–1.2)
Total Protein: 6.8 g/dL (ref 6.0–8.3)

## 2013-11-14 ENCOUNTER — Ambulatory Visit: Payer: 59

## 2013-11-14 DIAGNOSIS — R7989 Other specified abnormal findings of blood chemistry: Secondary | ICD-10-CM

## 2013-11-14 LAB — T3, FREE: T3, Free: 2.8 pg/mL (ref 2.3–4.2)

## 2013-11-14 LAB — T4, FREE: Free T4: 0.9 ng/dL (ref 0.60–1.60)

## 2013-11-15 ENCOUNTER — Encounter: Payer: Self-pay | Admitting: Family Medicine

## 2013-11-15 ENCOUNTER — Ambulatory Visit (INDEPENDENT_AMBULATORY_CARE_PROVIDER_SITE_OTHER): Payer: 59 | Admitting: Family Medicine

## 2013-11-15 VITALS — BP 113/77 | HR 60 | Temp 97.8°F | Resp 16 | Ht 62.0 in | Wt 125.0 lb

## 2013-11-15 DIAGNOSIS — Z0000 Encounter for general adult medical examination without abnormal findings: Secondary | ICD-10-CM

## 2013-11-15 DIAGNOSIS — Z1239 Encounter for other screening for malignant neoplasm of breast: Secondary | ICD-10-CM

## 2013-11-15 DIAGNOSIS — Z23 Encounter for immunization: Secondary | ICD-10-CM

## 2013-11-15 NOTE — Assessment & Plan Note (Signed)
Reviewed age and gender appropriate health maintenance issues (prudent diet, regular exercise, health risks of tobacco and excessive alcohol, use of seatbelts, fire alarms in home, use of sunscreen).  Also reviewed age and gender appropriate health screening as well as vaccine recommendations. HP labs reviewed: TSH mildly low, T4 and T3 normal.  Plan to repeat TSH in 3 mo. Flu vaccine IM and Tdap IM today. Pap UTD--she will f/u routinely with GYN. First screening mammogram ordered today to be done early next year.

## 2013-11-15 NOTE — Progress Notes (Signed)
Office Note 11/15/2013  CC: No chief complaint on file.   HPI:  Katie Beck is a 42 y.o. [place]  female who is here for CPE, says she is switching to me from another Optician, dispensing for her primary care. Has GYN MD at [facility] in [place].  Pap was normal 2013.  No acute complaints. Reviewed recent fasting health panel labs in detail. All normal except TSH slightly low, but free T4 and T3 total were normal.   No dietary restrictions.  Not exercising lately but likes to walk/run in warm weather months.   Past Medical History  Diagnosis Date  . 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 Hyperlipidemia   . CTS (carpal tunnel syndrome)   . Vitamin D deficiency 10/30/2012  . Anemia 10/30/2012    Past Surgical History  Procedure Laterality Date  . Cesarean section  2009 &2002    Family History  Problem Relation Age of Onset  . Diabetes Father   . Hypertension Father   . Hypertension Sister   . Diabetes Sister   . Hypertension Mother   . Diabetes Mother   . Hyperlipidemia Mother   . Thyroid disease Mother   . Diabetes Maternal Grandfather     History   Social History  . Marital Status: Married    Spouse Name: N/A    Number of Children: N/A  . Years of Education: N/A   Occupational History  . Not on file.   Social History Main Topics  . Smoking status: Never Smoker   . Smokeless tobacco: Never Used  . Alcohol Use: No  . Drug Use: No  . Sexual Activity: Yes   Other Topics Concern  . 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         pets no        NO ets or tobacco.         Vegetarian      Smoking Status:  never      Caffeine use/day:  3-4      Does Patient Exercise:  no            MEDS: none  No Known Allergies  ROS Review of Systems  Constitutional: Negative for fever, chills, appetite  change and fatigue.  HENT: Negative for congestion, dental problem, ear pain and sore throat.   Eyes: Negative for discharge, redness and visual disturbance.  Respiratory: Negative for cough, chest tightness, shortness of breath and wheezing.   Cardiovascular: Negative for chest pain, palpitations and leg swelling.  Gastrointestinal: Negative for nausea, vomiting, abdominal pain, diarrhea and blood in stool.  Genitourinary: Negative for dysuria, urgency, frequency, hematuria, flank pain and difficulty urinating.  Musculoskeletal: Negative for arthralgias, back pain, joint swelling, myalgias and neck stiffness.  Skin: Negative for pallor and rash.  Neurological: Negative for dizziness, speech difficulty, weakness and headaches.  Hematological: Negative for adenopathy. Does not bruise/bleed easily.  Psychiatric/Behavioral: Negative for confusion and sleep disturbance. The patient is not nervous/anxious.      PE; Blood pressure 113/77, pulse 60, temperature 97.8 F (36.6 C), temperature source Temporal, resp. rate 16, height 5\' 2"  (1.575 m), weight 125 lb (56.7 kg), last menstrual period 11/08/2013, SpO2 100.00%. Gen: Alert, well appearing.  Patient is oriented to person, place, time, and situation. AFFECT: pleasant, lucid thought and speech. ENT: Ears: EACs clear, normal epithelium.  TMs with good light reflex and landmarks bilaterally.  Eyes: no injection, icteris, swelling, or exudate.  EOMI, PERRLA. Nose: no drainage or turbinate edema/swelling.  No injection or focal lesion.  Mouth: lips without lesion/swelling.  Oral mucosa pink and moist.  Dentition intact and without obvious caries or gingival swelling.  Oropharynx without erythema, exudate, or swelling.  Neck: supple/nontender.  No LAD, mass, or TM.  Carotid pulses 2+ bilaterally, without bruits. CV: RRR, no m/r/g.   LUNGS: CTA bilat, nonlabored resps, good aeration in all lung fields. ABD: soft, NT, ND, BS normal.  No hepatospenomegaly  or mass.  No bruits. EXT: no clubbing, cyanosis, or edema.  Musculoskeletal: no joint swelling, erythema, warmth, or tenderness.  ROM of all joints intact. Skin - no sores or suspicious lesions or rashes or color changes   Pertinent labs:  Lab Results  Component Value Date   WBC 5.3 11/13/2013   HGB 13.6 11/13/2013   HCT 40.5 11/13/2013   MCV 87.1 11/13/2013   PLT 313.0 11/13/2013     Chemistry      Component Value Date/Time   NA 141 11/13/2013 0828   K 3.9 11/13/2013 0828   CL 107 11/13/2013 0828   CO2 26 11/13/2013 0828   BUN 19 11/13/2013 0828   CREATININE 0.8 11/13/2013 0828      Component Value Date/Time   CALCIUM 9.0 11/13/2013 0828   ALKPHOS 61 11/13/2013 0828   AST 18 11/13/2013 0828   ALT 25 11/13/2013 0828   BILITOT 0.2* 11/13/2013 0828     Lab Results  Component Value Date   CHOL 176 11/13/2013   HDL 45.70 11/13/2013   LDLCALC 111* 11/13/2013   TRIG 96.0 11/13/2013   CHOLHDL 4 11/13/2013   Lab Results  Component Value Date   TSH 0.23* 11/13/2013     ASSESSMENT AND PLAN:   Health maintenance examination Reviewed age and gender appropriate health maintenance issues (prudent diet, regular exercise, health risks of tobacco and excessive alcohol, use of seatbelts, fire alarms in home, use of sunscreen).  Also reviewed age and gender appropriate health screening as well as vaccine recommendations. HP labs reviewed: TSH mildly low, T4 and T3 normal.  Plan to repeat TSH in 3 mo. Flu vaccine IM and Tdap IM today. Pap UTD--she will f/u routinely with GYN. First screening mammogram ordered today to be done early next year.  Start MVI qd, discussed calcium supplementation since she says she eats a fairly low amount of dairy.  FOLLOW UP:  Return in about 1 year (around 11/15/2014) for CPE.

## 2013-11-15 NOTE — Progress Notes (Signed)
Pre visit review using our clinic review tool, if applicable. No additional management support is needed unless otherwise documented below in the visit note. 

## 2013-11-18 ENCOUNTER — Encounter: Payer: 59 | Admitting: Family Medicine

## 2013-12-18 ENCOUNTER — Ambulatory Visit (HOSPITAL_BASED_OUTPATIENT_CLINIC_OR_DEPARTMENT_OTHER): Payer: 59

## 2014-07-27 ENCOUNTER — Encounter (HOSPITAL_COMMUNITY): Payer: Self-pay | Admitting: Emergency Medicine

## 2014-07-27 ENCOUNTER — Emergency Department (HOSPITAL_COMMUNITY): Payer: 59

## 2014-07-27 ENCOUNTER — Emergency Department (HOSPITAL_COMMUNITY)
Admission: EM | Admit: 2014-07-27 | Discharge: 2014-07-27 | Disposition: A | Discharge status: Home / Self Care | Payer: 59 | Attending: Emergency Medicine | Admitting: Emergency Medicine

## 2014-07-27 DIAGNOSIS — Z862 Personal history of diseases of the blood and blood-forming organs and certain disorders involving the immune mechanism: Secondary | ICD-10-CM | POA: Diagnosis not present

## 2014-07-27 DIAGNOSIS — H8120 Vestibular neuronitis, unspecified ear: Secondary | ICD-10-CM

## 2014-07-27 DIAGNOSIS — Z3202 Encounter for pregnancy test, result negative: Secondary | ICD-10-CM | POA: Diagnosis not present

## 2014-07-27 DIAGNOSIS — Z8639 Personal history of other endocrine, nutritional and metabolic disease: Secondary | ICD-10-CM | POA: Insufficient documentation

## 2014-07-27 DIAGNOSIS — H8110 Benign paroxysmal vertigo, unspecified ear: Secondary | ICD-10-CM | POA: Insufficient documentation

## 2014-07-27 DIAGNOSIS — R42 Dizziness and giddiness: Secondary | ICD-10-CM | POA: Diagnosis present

## 2014-07-27 DIAGNOSIS — Z8742 Personal history of other diseases of the female genital tract: Secondary | ICD-10-CM | POA: Diagnosis not present

## 2014-07-27 DIAGNOSIS — F411 Generalized anxiety disorder: Secondary | ICD-10-CM | POA: Diagnosis not present

## 2014-07-27 HISTORY — DX: Vestibular neuronitis, unspecified ear: H81.20

## 2014-07-27 LAB — CBG MONITORING, ED: Glucose-Capillary: 121 mg/dL — ABNORMAL HIGH (ref 70–99)

## 2014-07-27 LAB — BASIC METABOLIC PANEL
ANION GAP: 14 (ref 5–15)
BUN: 10 mg/dL (ref 6–23)
CO2: 19 meq/L (ref 19–32)
Calcium: 8.9 mg/dL (ref 8.4–10.5)
Chloride: 106 mEq/L (ref 96–112)
Creatinine, Ser: 0.58 mg/dL (ref 0.50–1.10)
GFR calc non Af Amer: >90 mL/min (ref >90)
Glucose, Bld: 117 mg/dL — ABNORMAL HIGH (ref 70–99)
POTASSIUM: 3.9 meq/L (ref 3.7–5.3)
Sodium: 139 mEq/L (ref 137–147)

## 2014-07-27 LAB — CBC
HCT: 39.4 % (ref 36.0–46.0)
Hemoglobin: 13.7 g/dL (ref 12.0–15.0)
MCH: 29.7 pg (ref 26.0–34.0)
MCHC: 34.8 g/dL (ref 30.0–36.0)
MCV: 85.5 fL (ref 78.0–100.0)
PLATELETS: 284 10*3/uL (ref 150–400)
RBC: 4.61 MIL/uL (ref 3.87–5.11)
RDW: 12.5 % (ref 11.5–15.5)
WBC: 8.1 10*3/uL (ref 4.0–10.5)

## 2014-07-27 LAB — URINALYSIS, ROUTINE W REFLEX MICROSCOPIC
Bilirubin Urine: NEGATIVE
GLUCOSE, UA: NEGATIVE mg/dL
Hgb urine dipstick: NEGATIVE
KETONES UR: NEGATIVE mg/dL
LEUKOCYTES UA: NEGATIVE
Nitrite: NEGATIVE
PROTEIN: NEGATIVE mg/dL
Specific Gravity, Urine: 1.011 (ref 1.005–1.030)
UROBILINOGEN UA: 0.2 mg/dL (ref 0.0–1.0)
pH: 7.5 (ref 5.0–8.0)

## 2014-07-27 LAB — POC URINE PREG, ED: Preg Test, Ur: NEGATIVE

## 2014-07-27 LAB — TROPONIN I: Troponin I: <0.3 ng/mL (ref <0.30)

## 2014-07-27 MED ORDER — MECLIZINE HCL 32 MG PO TABS: 32.0000 mg | ORAL_TABLET | Freq: Three times a day (TID) | ORAL | Status: DC | PRN

## 2014-07-27 MED ORDER — SODIUM CHLORIDE 0.9 % IV BOLUS (SEPSIS)
500.0000 mL | Freq: Once | INTRAVENOUS | Status: AC
  Administered 2014-07-27: 500 mL via INTRAVENOUS

## 2014-07-27 MED ORDER — IOHEXOL 350 MG/ML SOLN
50.0000 mL | Freq: Once | INTRAVENOUS | Status: AC | PRN
  Administered 2014-07-27: 50 mL via INTRAVENOUS

## 2014-07-27 MED ORDER — MECLIZINE HCL 25 MG PO TABS
25.0000 mg | ORAL_TABLET | Freq: Once | ORAL | Status: AC
  Administered 2014-07-27: 25 mg via ORAL
  Filled 2014-07-27: qty 1

## 2014-07-27 MED ORDER — DIAZEPAM 2 MG PO TABS: 2.0000 mg | ORAL_TABLET | Freq: Three times a day (TID) | ORAL | Status: DC | PRN

## 2014-07-27 MED ORDER — DIAZEPAM 2 MG PO TABS
2.0000 mg | ORAL_TABLET | Freq: Once | ORAL | Status: AC
  Administered 2014-07-27: 2 mg via ORAL
  Filled 2014-07-27: qty 1

## 2014-07-27 NOTE — ED Notes (Signed)
Pt back from CT.  States no relief after medications.  MD notified.

## 2014-07-27 NOTE — ED Notes (Signed)
She states she had felt dizzy since yesterday. She states "it feels like the room is spinning around me and it gets worse when i stand up." she denies any pain. shes alert and oriented x 4. Breathing easily

## 2014-07-27 NOTE — Discharge Instructions (Signed)
Please take the meclizine for the next 2 days as prescribed; after 2 days, if your symptoms are gone, you may stop the medication. Do the particle repositioning maneuvers as per the printed instructions to help with symptoms.  Do not drive until your symptoms have dissipated.  Benign Positional Vertigo Vertigo means you feel like you or your surroundings are moving when they are not. Benign positional vertigo is the most common form of vertigo. Benign means that the cause of your condition is not serious. Benign positional vertigo is more common in older adults. CAUSES  Benign positional vertigo is the result of an upset in the labyrinth system. This is an area in the middle ear that helps control your balance. This may be caused by a viral infection, head injury, or repetitive motion. However, often no specific cause is found. SYMPTOMS  Symptoms of benign positional vertigo occur when you move your head or eyes in different directions. Some of the symptoms may include:  Loss of balance and falls.  Vomiting.  Blurred vision.  Dizziness.  Nausea.  Involuntary eye movements (nystagmus). DIAGNOSIS  Benign positional vertigo is usually diagnosed by physical exam. If the specific cause of your benign positional vertigo is unknown, your caregiver may perform imaging tests, such as magnetic resonance imaging (MRI) or computed tomography (CT). TREATMENT  Your caregiver may recommend movements or procedures to correct the benign positional vertigo. Medicines such as meclizine, benzodiazepines, and medicines for nausea may be used to treat your symptoms. In rare cases, if your symptoms are caused by certain conditions that affect the inner ear, you may need surgery. HOME CARE INSTRUCTIONS   Follow your caregiver's instructions.  Move slowly. Do not make sudden body or head movements.  Avoid driving.  Avoid operating heavy machinery.  Avoid performing any tasks that would be dangerous to you  or others during a vertigo episode.  Drink enough fluids to keep your urine clear or pale yellow. SEEK IMMEDIATE MEDICAL CARE IF:   You develop problems with walking, weakness, numbness, or using your arms, hands, or legs.  You have difficulty speaking.  You develop severe headaches.  Your nausea or vomiting continues or gets worse.  You develop visual changes.  Your family or friends notice any behavioral changes.  Your condition gets worse.  You have a fever.  You develop a stiff neck or sensitivity to light. MAKE SURE YOU:   Understand these instructions.  Will watch your condition.  Will get help right away if you are not doing well or get worse. Document Released: 08/22/2006 Document Revised: 02/06/2012 Document Reviewed: 08/04/2011 [facility] Patient Information 2015 [place], [place]. This information is not intended to replace advice given to you by your health care provider. Make sure you discuss any questions you have with your health care provider.

## 2014-07-27 NOTE — ED Notes (Signed)
EDP at bedside  

## 2014-07-27 NOTE — ED Provider Notes (Signed)
I saw and evaluated the patient, reviewed the resident's note and I agree with the findings and plan.   EKG Interpretation None       Date: 07/27/2014  Rate: 55  Rhythm: sinus bradycardia  QRS Axis: normal  Intervals: normal  ST/T Wave abnormalities: nonspecific ST/T changes  Conduction Disutrbances:none  Narrative Interpretation:   Old EKG Reviewed: none available  Pt comes in with dizziness, described as spinning sensation. Pt has had a few episodes of the dizziness. No focal neuro deficits. Sx are reproducible. PT is trying swimming, and had a recent salon visit, where she was hyperextended for a while - so CT A ordered. If neg, will tx as BPPV.  Derwood Kaplan, MD 07/27/14 1733

## 2014-07-27 NOTE — ED Provider Notes (Signed)
CSN: [id number]     Arrival date & time 07/27/14  8657 History   First MD Initiated Contact with Patient 07/27/14 [phone]     Chief Complaint  Patient presents with  . Dizziness   HPI Ms. Duignan is a 43 yo woman with a PMH of hyperlipidemia, PCOS and allergic rhinitis who is presenting with a one day history of episodes of dizziness. Yesterday, she had a short episode of feeling like the room was spinning while she was in the bathroom. She shouted out for her husband, felt like she was going to fall, then recovered. About 12 hours later, last night, she lay down in her bed. She got up, and had another episode of feeling like the room was spinning; it was significant enough that she actually fell back onto the bed. Her husband found her to be sweaty, anxious, and breathing quickly shortly after the episode. She did not hit her head or lose consciousness. She was able to sleep, but while tossing and turning around 2 am, she had yet another episode. She is on no medications. She is now comfortable as long as she does not move her head quickly. She endorses some nausea this morning with one episode of vomiting, but denies chest pain or any history of head trauma.   She does recall 2 other brief episodes of dizziness in the past. One occurred years ago after she recovered from the flu; the other occurred very briefly last weekend.  Of note, she does have increased stressors at the moment (sister and brother with recent medical diagnoses) and decreased sleep.   Past Medical History  Diagnosis Date  . 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 Hyperlipidemia   . CTS (carpal tunnel syndrome)   . Vitamin D deficiency 10/30/2012  . Anemia 10/30/2012   Past Surgical History  Procedure Laterality Date  . Cesarean section  2009 &2002   Family History  Problem Relation Age of Onset  . Diabetes Father   . Hypertension Father   . Hypertension Sister   . Diabetes Sister   . Hypertension Mother    . Diabetes Mother   . Hyperlipidemia Mother   . Thyroid disease Mother   . Diabetes Maternal Grandfather    History  Substance Use Topics  . Smoking status: Never Smoker   . Smokeless tobacco: Never Used  . Alcohol Use: No   OB History   Grav Para Term Preterm Abortions TAB SAB Ect Mult Living                 Review of Systems General: recent stressors, no recent illness Skin: no rashes or lesions HEENT: no headaches, no changes in vision, no tinnitus Cardiac: no chest pain, no palpitations Respiratory: no shortness of breath GI: some nausea and one episode of vomiting this morning, no changes in BMs, no abdominal pain Urinary: no changes in urination Msk: no soreness or weakness Endocrine: no temperature intolerance, no weight changes Psychiatric: recent stressors, anxiety   Allergies  Review of patient's allergies indicates no known allergies.  Home Medications   Prior to Admission medications   Not on File   BP 120/65  Temp(Src) 98.3 F (36.8 C) (Oral)  Resp 13  SpO2 100%  LMP 06/30/2014 Physical Exam Appearance: sitting up in bed, not moving head from central positioning - following examiner with eyes but not turning head HEENT: AT/[place], PERRL, EOMi, no nystagmus, negative HINTS exam, normal hearing Heart: bradycardic, normal  rhythm, normal S1S2 Lungs: CTAB, no wheezes Abdomen: thin, BS+, nontender Musculoskeletal: nontender Extremities: no edema b/l Neurologic: sensation intact throughout, strength intact throughout, coordination intact Skin: no lesions, no rashes  ED Course  Procedures (including critical care time) Labs Review Labs Reviewed  CBG MONITORING, ED - Abnormal; Notable for the following:    Glucose-Capillary 121 (*)    All other components within normal limits  CBC  BASIC METABOLIC PANEL  TROPONIN I  URINALYSIS, ROUTINE W REFLEX MICROSCOPIC  POC URINE PREG, ED    Imaging Review Ct Angio Neck W/cm &/or Wo/cm  07/27/2014   CLINICAL  DATA:  Dizziness since yesterday.  EXAM: CT ANGIOGRAPHY NECK  TECHNIQUE: Multidetector CT imaging of the neck was performed using the standard protocol during bolus administration of intravenous contrast. Multiplanar CT image reconstructions and MIPs were obtained to evaluate the vascular anatomy. Carotid stenosis measurements (when applicable) are obtained utilizing NASCET criteria, using the distal internal carotid diameter as the denominator.  CONTRAST:  50mL OMNIPAQUE IOHEXOL 350 MG/ML SOLN  COMPARISON:  None.  FINDINGS: Conventional branching of the great vessels from the arch. No proximal stenosis. No vertebral ostial disease.  The carotid bifurcations are widely patent. There is no stenosis, ulceration, or dissection. No cervical fibromuscular disease.  Vertebral arteries are equal in size and widely patent through the neck. Each contribute to the basilar artery intracranially.  No cervical adenopathy. No neck masses. No lung apex lesion. Negative osseous structures. No sinus disease.  Review of the MIP images confirms the above findings.  IMPRESSION: Unremarkable CTA neck. No carotid stenosis or dissection. Symmetric vertebral arteries without ostial disease.   Electronically Signed   By: Davonna Belling M.D.   On: 07/27/2014 14:21     EKG Interpretation None      MDM   Final diagnoses:  Benign paroxysmal vertigo, unspecified laterality   Ms. Graber is a 43 yo woman who has experienced several episodes of vertigo over the past day in the context of quite a bit of anxiety. The episodes have been brought on by head movement (rolling over in bed, sitting up in bed). Because she also has a history of getting a facial 3 days ago, during which her head was hyperextended, a CT angiogram of her neck was performed to rule out dissection. It showed no signs of dissection. Ms. Newhall symptoms are most consistent with BPPV. She was given particle repositioning maneuver instructions and advised to follow-up  with ENT next week. She was told to avoid driving until she felt less dizzy. She was also prescribed meclizine and a small amount of valium (to be used if meclizine is not helping).    Dionne Ano, MD 07/27/14 [phone]

## 2014-07-27 NOTE — ED Notes (Signed)
Patient transported to CT 

## 2014-07-27 NOTE — ED Notes (Signed)
CBG 129 

## 2014-07-28 ENCOUNTER — Other Ambulatory Visit: Payer: Self-pay | Admitting: Family Medicine

## 2014-07-28 ENCOUNTER — Encounter: Payer: Self-pay | Admitting: Family Medicine

## 2014-07-28 ENCOUNTER — Telehealth: Payer: Self-pay | Admitting: Family Medicine

## 2014-07-28 DIAGNOSIS — H8110 Benign paroxysmal vertigo, unspecified ear: Secondary | ICD-10-CM

## 2014-07-28 NOTE — Telephone Encounter (Signed)
Spoke with pt, advised referral sent. Pt understood.

## 2014-07-28 NOTE — Telephone Encounter (Signed)
OK, referral to [facility] ENT ordered.

## 2014-07-28 NOTE — Telephone Encounter (Signed)
Pt went to [facility] ED and was diagnosed w/ vertigo.  Hospital Dr told her to f/u w/ ENT.  [facility] needs referral.  Please advise.

## 2014-07-30 ENCOUNTER — Encounter: Payer: Self-pay | Admitting: Family Medicine

## 2014-12-09 ENCOUNTER — Encounter: Payer: 59 | Admitting: Family Medicine

## 2014-12-09 ENCOUNTER — Other Ambulatory Visit (INDEPENDENT_AMBULATORY_CARE_PROVIDER_SITE_OTHER): Payer: 59

## 2014-12-09 DIAGNOSIS — Z0000 Encounter for general adult medical examination without abnormal findings: Secondary | ICD-10-CM

## 2014-12-09 LAB — COMPREHENSIVE METABOLIC PANEL
ALBUMIN: 4.3 g/dL (ref 3.5–5.2)
ALT: 23 U/L (ref 0–35)
AST: 17 U/L (ref 0–37)
Alkaline Phosphatase: 70 U/L (ref 39–117)
BILIRUBIN TOTAL: 0.7 mg/dL (ref 0.2–1.2)
BUN: 13 mg/dL (ref 6–23)
CALCIUM: 9.1 mg/dL (ref 8.4–10.5)
CO2: 26 mEq/L (ref 19–32)
Chloride: 107 mEq/L (ref 96–112)
Creatinine, Ser: 0.7 mg/dL (ref 0.4–1.2)
GFR: 106.69 mL/min (ref >60.00)
GLUCOSE: 90 mg/dL (ref 70–99)
Potassium: 4.2 mEq/L (ref 3.5–5.1)
SODIUM: 136 meq/L (ref 135–145)
Total Protein: 6.8 g/dL (ref 6.0–8.3)

## 2014-12-09 LAB — CBC WITH DIFFERENTIAL/PLATELET
Basophils Absolute: 0 10*3/uL (ref 0.0–0.1)
Basophils Relative: 0.5 % (ref 0.0–3.0)
EOS ABS: 0.2 10*3/uL (ref 0.0–0.7)
Eosinophils Relative: 2.2 % (ref 0.0–5.0)
HCT: 42.3 % (ref 36.0–46.0)
HEMOGLOBIN: 14 g/dL (ref 12.0–15.0)
Lymphocytes Relative: 39.6 % (ref 12.0–46.0)
Lymphs Abs: 2.8 10*3/uL (ref 0.7–4.0)
MCHC: 33.1 g/dL (ref 30.0–36.0)
MCV: 88 fl (ref 78.0–100.0)
MONO ABS: 0.5 10*3/uL (ref 0.1–1.0)
Monocytes Relative: 7 % (ref 3.0–12.0)
NEUTROS ABS: 3.5 10*3/uL (ref 1.4–7.7)
Neutrophils Relative %: 50.7 % (ref 43.0–77.0)
Platelets: 327 10*3/uL (ref 150.0–400.0)
RBC: 4.8 Mil/uL (ref 3.87–5.11)
RDW: 13 % (ref 11.5–15.5)
WBC: 7 10*3/uL (ref 4.0–10.5)

## 2014-12-09 LAB — LIPID PANEL
Cholesterol: 197 mg/dL (ref 0–200)
HDL: 46.1 mg/dL (ref >39.00)
LDL Cholesterol: 122 mg/dL — ABNORMAL HIGH (ref 0–99)
NONHDL: 150.9
Total CHOL/HDL Ratio: 4
Triglycerides: 147 mg/dL (ref 0.0–149.0)
VLDL: 29.4 mg/dL (ref 0.0–40.0)

## 2014-12-09 LAB — TSH: TSH: 1.84 u[IU]/mL (ref 0.35–4.50)

## 2014-12-10 ENCOUNTER — Encounter: Payer: Self-pay | Admitting: Family Medicine

## 2014-12-10 ENCOUNTER — Ambulatory Visit (INDEPENDENT_AMBULATORY_CARE_PROVIDER_SITE_OTHER): Payer: 59 | Admitting: Family Medicine

## 2014-12-10 VITALS — BP 112/72 | HR 80 | Temp 98.0°F | Resp 16 | Ht 62.0 in | Wt 131.0 lb

## 2014-12-10 DIAGNOSIS — Z23 Encounter for immunization: Secondary | ICD-10-CM

## 2014-12-10 DIAGNOSIS — Z0000 Encounter for general adult medical examination without abnormal findings: Secondary | ICD-10-CM

## 2014-12-10 DIAGNOSIS — Z1239 Encounter for other screening for malignant neoplasm of breast: Secondary | ICD-10-CM

## 2014-12-10 NOTE — Progress Notes (Signed)
Office Note 12/10/2014  CC:  Chief Complaint  Patient presents with  . Annual Exam    HPI:  Katie Beck is a 44 y.o. [place]  female who is here for CPE. Reviewed recent fasting labs in detail today. Feeling fine but worried about FH of diabetes, plus her brother recently died of pancreatic cancer. She wants to get mammogram this year: deferred it last year.   Exercises in warm months of the year, diet is fair.  Past Medical History  Diagnosis Date  . 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 Hyperlipidemia   . CTS (carpal tunnel syndrome)   . Vitamin D deficiency 10/30/2012  . Anemia 10/30/2012  . Vestibular neuronitis 07/27/14    ED visit for vertigo: CT angio neck normal    Past Surgical History  Procedure Laterality Date  . Cesarean section  2009 &2002    Family History  Problem Relation Age of Onset  . Diabetes Father   . Hypertension Father   . Hypertension Sister   . Diabetes Sister   . Hypertension Mother   . Diabetes Mother   . Hyperlipidemia Mother   . Thyroid disease Mother   . Diabetes Maternal Grandfather     History   Social History  . Marital Status: Married    Spouse Name: N/A    Number of Children: N/A  . Years of Education: N/A   Occupational History  . Not on file.   Social History Main Topics  . Smoking status: Never Smoker   . Smokeless tobacco: Never Used  . Alcohol Use: No  . Drug Use: No  . Sexual Activity: Yes   Other Topics Concern  . 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         pets no        NO ets or tobacco.         Vegetarian      Smoking Status:  never      Caffeine use/day:  3-4      Does Patient Exercise:  no             Outpatient Prescriptions Prior to Visit  Medication Sig Dispense Refill  . acetaminophen (TYLENOL) 500 MG tablet Take 1,000 mg by  mouth every 6 (six) hours as needed for mild pain.    . cetirizine (ZYRTEC) 10 MG tablet Take 10 mg by mouth daily as needed for allergies.    . diazepam (VALIUM) 2 MG tablet Take 1 tablet (2 mg total) by mouth every 8 (eight) hours as needed for anxiety. (Patient not taking: Reported on 12/10/2014) 5 tablet 0  . meclizine (ANTIVERT) 32 MG tablet Take 1 tablet (32 mg total) by mouth 3 (three) times daily as needed. (Patient not taking: Reported on 12/10/2014) 20 tablet 0   No facility-administered medications prior to visit.    No Known Allergies  ROS Review of Systems  Constitutional: Negative for fever, chills, appetite change and fatigue.  HENT: Negative for congestion, dental problem, ear pain and sore throat.   Eyes: Negative for discharge, redness and visual disturbance.  Respiratory: Negative for cough, chest tightness, shortness of breath and wheezing.   Cardiovascular: Negative for chest pain, palpitations and leg swelling.  Gastrointestinal: Negative for nausea, vomiting, abdominal pain, diarrhea and blood in stool.  Genitourinary: Negative for dysuria, urgency, frequency, hematuria, flank pain and difficulty urinating.  Musculoskeletal: Negative for myalgias, back pain, joint swelling, arthralgias and neck stiffness.  Skin: Negative for pallor and rash.  Neurological: Negative for dizziness, speech difficulty, weakness and headaches.  Hematological: Negative for adenopathy. Does not bruise/bleed easily.  Psychiatric/Behavioral: Negative for confusion and sleep disturbance. The patient is not nervous/anxious.     PE; Blood pressure 112/72, pulse 80, temperature 98 F (36.7 C), temperature source Temporal, resp. rate 16, height  (1.575 m), weight 131 lb (59.421 kg), SpO2 100 %. Gen: Alert, well appearing.  Patient is oriented to person, place, time, and situation. AFFECT: pleasant, lucid thought and speech. ENT: Ears: EACs clear, normal epithelium.  TMs with good light  reflex and landmarks bilaterally.  Eyes: no injection, icteris, swelling, or exudate.  EOMI, PERRLA. Nose: no drainage or turbinate edema/swelling.  No injection or focal lesion.  Mouth: lips without lesion/swelling.  Oral mucosa pink and moist.  Dentition intact and without obvious caries or gingival swelling.  Oropharynx without erythema, exudate, or swelling.  Neck: supple/nontender.  No LAD, mass, or TM.  Carotid pulses 2+ bilaterally, without bruits. CV: RRR, no m/r/g.   LUNGS: CTA bilat, nonlabored resps, good aeration in all lung fields. ABD: soft, NT, ND, BS normal.  No hepatospenomegaly or mass.  No bruits. EXT: no clubbing, cyanosis, or edema.  Musculoskeletal: no joint swelling, erythema, warmth, or tenderness.  ROM of all joints intact. Skin - no sores or suspicious lesions or rashes or color changes   Pertinent labs:  Lab Results  Component Value Date   WBC 7.0 12/09/2014   HGB 14.0 12/09/2014   HCT 42.3 12/09/2014   MCV 88.0 12/09/2014   PLT 327.0 12/09/2014   Lab Results  Component Value Date   CHOL 197 12/09/2014   HDL 46.10 12/09/2014   LDLCALC 122* 12/09/2014   TRIG 147.0 12/09/2014   CHOLHDL 4 12/09/2014     Chemistry      Component Value Date/Time   NA 136 12/09/2014 0930   K 4.2 12/09/2014 0930   CL 107 12/09/2014 0930   CO2 26 12/09/2014 0930   BUN 13 12/09/2014 0930   CREATININE 0.7 12/09/2014 0930      Component Value Date/Time   CALCIUM 9.1 12/09/2014 0930   ALKPHOS 70 12/09/2014 0930   AST 17 12/09/2014 0930   ALT 23 12/09/2014 0930   BILITOT 0.7 12/09/2014 0930     Lab Results  Component Value Date   TSH 1.84 12/09/2014    ASSESSMENT AND PLAN:   Health maintenance examination Reviewed age and gender appropriate health maintenance issues (prudent diet, regular exercise, health risks of tobacco and excessive alcohol, use of seatbelts, fire alarms in home, use of sunscreen).  Also reviewed age and gender appropriate health screening as  well as vaccine recommendations. Flu vaccine IM today. HP labs great. Mammogram (initial screening) ordered today. Recommended pt keep up with pap/pelvic: her options are Dr. Vincente Poli or myself.  She also has option to decline this screening.  She expressed understanding.   An After Visit Summary was printed and given to the patient.  FOLLOW UP:  Return in about 1 year (around 12/11/2015) for annual CPE with fasting labs the week prior.

## 2014-12-10 NOTE — Assessment & Plan Note (Signed)
Reviewed age and gender appropriate health maintenance issues (prudent diet, regular exercise, health risks of tobacco and excessive alcohol, use of seatbelts, fire alarms in home, use of sunscreen).  Also reviewed age and gender appropriate health screening as well as vaccine recommendations. Flu vaccine IM today. HP labs great. Mammogram (initial screening) ordered today. Recommended pt keep up with pap/pelvic: her options are Dr. Vincente PoliGrewal or myself.  She also has option to decline this screening.  She expressed understanding.

## 2015-02-05 ENCOUNTER — Encounter (INDEPENDENT_AMBULATORY_CARE_PROVIDER_SITE_OTHER): Payer: Self-pay

## 2015-02-05 ENCOUNTER — Ambulatory Visit
Admission: RE | Admit: 2015-02-05 | Discharge: 2015-02-05 | Disposition: A | Discharge status: Home / Self Care | Payer: 59 | Source: Ambulatory Visit | Attending: Family Medicine | Admitting: Family Medicine

## 2015-02-05 ENCOUNTER — Ambulatory Visit: Payer: 59

## 2015-02-05 ENCOUNTER — Other Ambulatory Visit: Payer: Self-pay | Admitting: Family Medicine

## 2015-02-05 DIAGNOSIS — Z1231 Encounter for screening mammogram for malignant neoplasm of breast: Secondary | ICD-10-CM

## 2015-07-14 ENCOUNTER — Ambulatory Visit: Payer: 59 | Admitting: Family Medicine

## 2015-12-15 ENCOUNTER — Encounter: Payer: 59 | Admitting: Family Medicine

## 2016-01-14 ENCOUNTER — Telehealth: Payer: Self-pay | Admitting: *Deleted

## 2016-01-14 ENCOUNTER — Other Ambulatory Visit: Payer: Self-pay | Admitting: *Deleted

## 2016-01-14 DIAGNOSIS — Z1329 Encounter for screening for other suspected endocrine disorder: Secondary | ICD-10-CM

## 2016-01-14 DIAGNOSIS — Z130 Encounter for screening for diseases of the blood and blood-forming organs and certain disorders involving the immune mechanism: Secondary | ICD-10-CM

## 2016-01-14 DIAGNOSIS — Z0000 Encounter for general adult medical examination without abnormal findings: Secondary | ICD-10-CM

## 2016-01-14 DIAGNOSIS — Z13220 Encounter for screening for lipoid disorders: Secondary | ICD-10-CM

## 2016-01-14 NOTE — Telephone Encounter (Signed)
-----   Message from Carmelia Bake sent at 01/14/2016  3:13 PM EST ----- Please put in lab orders for fasting CPE. Thanks

## 2016-01-14 NOTE — Telephone Encounter (Signed)
Please advise, which labs and Dx. Thanks.  

## 2016-01-15 NOTE — Telephone Encounter (Signed)
Enter for here at our lab or for [facility]? Pls order CBC w/diff, CMET, TSH, and lipid panel.-thx

## 2016-01-18 ENCOUNTER — Encounter: Payer: 59 | Admitting: Family Medicine

## 2016-01-18 ENCOUNTER — Other Ambulatory Visit (INDEPENDENT_AMBULATORY_CARE_PROVIDER_SITE_OTHER): Payer: 59

## 2016-01-18 DIAGNOSIS — Z13220 Encounter for screening for lipoid disorders: Secondary | ICD-10-CM | POA: Diagnosis not present

## 2016-01-18 DIAGNOSIS — Z0000 Encounter for general adult medical examination without abnormal findings: Secondary | ICD-10-CM | POA: Diagnosis not present

## 2016-01-18 DIAGNOSIS — Z1329 Encounter for screening for other suspected endocrine disorder: Secondary | ICD-10-CM

## 2016-01-18 DIAGNOSIS — Z130 Encounter for screening for diseases of the blood and blood-forming organs and certain disorders involving the immune mechanism: Secondary | ICD-10-CM

## 2016-01-18 LAB — COMPREHENSIVE METABOLIC PANEL
ALBUMIN: 4.2 g/dL (ref 3.5–5.2)
ALT: 15 U/L (ref 0–35)
AST: 14 U/L (ref 0–37)
Alkaline Phosphatase: 56 U/L (ref 39–117)
BUN: 10 mg/dL (ref 6–23)
CO2: 23 mEq/L (ref 19–32)
Calcium: 9.1 mg/dL (ref 8.4–10.5)
Chloride: 108 mEq/L (ref 96–112)
Creatinine, Ser: 0.73 mg/dL (ref 0.40–1.20)
GFR: 92.81 mL/min (ref >60.00)
Glucose, Bld: 94 mg/dL (ref 70–99)
POTASSIUM: 4 meq/L (ref 3.5–5.1)
SODIUM: 139 meq/L (ref 135–145)
Total Bilirubin: 0.4 mg/dL (ref 0.2–1.2)
Total Protein: 6.5 g/dL (ref 6.0–8.3)

## 2016-01-18 LAB — LIPID PANEL
CHOLESTEROL: 174 mg/dL (ref 0–200)
HDL: 49.2 mg/dL (ref >39.00)
LDL Cholesterol: 107 mg/dL — ABNORMAL HIGH (ref 0–99)
NonHDL: 124.34
Total CHOL/HDL Ratio: 4
Triglycerides: 88 mg/dL (ref 0.0–149.0)
VLDL: 17.6 mg/dL (ref 0.0–40.0)

## 2016-01-18 LAB — CBC
HEMATOCRIT: 39 % (ref 36.0–46.0)
Hemoglobin: 13.1 g/dL (ref 12.0–15.0)
MCHC: 33.7 g/dL (ref 30.0–36.0)
MCV: 87.8 fl (ref 78.0–100.0)
Platelets: 338 10*3/uL (ref 150.0–400.0)
RBC: 4.45 Mil/uL (ref 3.87–5.11)
RDW: 13.3 % (ref 11.5–15.5)
WBC: 5.7 10*3/uL (ref 4.0–10.5)

## 2016-01-18 LAB — TSH: TSH: 1.18 u[IU]/mL (ref 0.35–4.50)

## 2016-01-22 ENCOUNTER — Encounter: Payer: Self-pay | Admitting: Family Medicine

## 2016-01-22 ENCOUNTER — Ambulatory Visit (INDEPENDENT_AMBULATORY_CARE_PROVIDER_SITE_OTHER): Payer: 59 | Admitting: Family Medicine

## 2016-01-22 VITALS — BP 93/58 | HR 64 | Temp 98.2°F | Resp 16 | Ht 61.5 in | Wt 128.5 lb

## 2016-01-22 DIAGNOSIS — Z0000 Encounter for general adult medical examination without abnormal findings: Secondary | ICD-10-CM | POA: Diagnosis not present

## 2016-01-22 DIAGNOSIS — G43829 Menstrual migraine, not intractable, without status migrainosus: Secondary | ICD-10-CM

## 2016-01-22 DIAGNOSIS — N943 Premenstrual tension syndrome: Secondary | ICD-10-CM | POA: Diagnosis not present

## 2016-01-22 MED ORDER — RIZATRIPTAN BENZOATE 10 MG PO TABS: 10.0000 mg | ORAL_TABLET | ORAL | Status: DC | PRN

## 2016-01-22 NOTE — Progress Notes (Signed)
Office Note 01/24/2016  CC:  Chief Complaint  Patient presents with  . Annual Exam    labs done 01/18/16    HPI:  Katie Beck is a 45 y.o. [place]  female who is here for annual health maintenance exam. Reviewed recent fasting labs in detail with pt today. She once again expresses a lot of worry about her FH of diabetes and a brother who died of pancreatic cancer in the last couple years.  I had to reassure her a lot today that nothing in her health hx or labs is present to worry about anything like that occurring in her right now.  She did c/o a few months hx of having frequent headaches during her menses time only.  Some are severe and associated with nausea and rarely she has to vomit.  She usually takes tylenol and it helps enough to be able to continue functioning but 1-2 time per month she has one that she feels is debilitating and tylenol is not helpful. She says she does have a hx of similar HAs in the past occuring at times other than her menses BUT they have been very infrequent so she has not mentioned them to me before.     Past Medical History  Diagnosis Date  . 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 Hyperlipidemia   . CTS (carpal tunnel syndrome)   . Vitamin D deficiency 10/30/2012  . Anemia 10/30/2012  . Vestibular neuronitis 07/27/14    ED visit for vertigo: CT angio neck normal    Past Surgical History  Procedure Laterality Date  . Cesarean section  2009 &2002    Family History  Problem Relation Age of Onset  . Diabetes Father   . Hypertension Father   . Hypertension Sister   . Diabetes Sister   . Hypertension Mother   . Diabetes Mother   . Hyperlipidemia Mother   . Thyroid disease Mother   . Diabetes Maternal Grandfather     Social History   Social History  . Marital Status: Married    Spouse Name: N/A  . Number of Children: N/A  . Years of Education: N/A   Occupational History  . Not on file.   Social History Main Topics   . Smoking status: Never Smoker   . Smokeless tobacco: Never Used  . Alcohol Use: No  . Drug Use: No  . Sexual Activity: Yes   Other Topics Concern  . 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         pets no        NO ets or tobacco.         Vegetarian      Smoking Status:  never      Caffeine use/day:  3-4      Does Patient Exercise:  no             Outpatient Prescriptions Prior to Visit  Medication Sig Dispense Refill  . acetaminophen (TYLENOL) 500 MG tablet Take 1,000 mg by mouth every 6 (six) hours as needed for mild pain.    . cetirizine (ZYRTEC) 10 MG tablet Take 10 mg by mouth daily as needed for allergies.    . diazepam (VALIUM) 2 MG tablet  Take 1 tablet (2 mg total) by mouth every 8 (eight) hours as needed for anxiety. (Patient not taking: Reported on 12/10/2014) 5 tablet 0  . meclizine (ANTIVERT) 32 MG tablet Take 1 tablet (32 mg total) by mouth 3 (three) times daily as needed. (Patient not taking: Reported on 12/10/2014) 20 tablet 0   No facility-administered medications prior to visit.    No Known Allergies  ROS Review of Systems  Constitutional: Negative for fever, chills, appetite change and fatigue.  HENT: Negative for congestion, dental problem, ear pain and sore throat.   Eyes: Negative for discharge, redness and visual disturbance.  Respiratory: Negative for cough, chest tightness, shortness of breath and wheezing.   Cardiovascular: Negative for chest pain, palpitations and leg swelling.  Gastrointestinal: Negative for nausea, vomiting, abdominal pain, diarrhea and blood in stool.  Genitourinary: Negative for dysuria, urgency, frequency, hematuria, flank pain and difficulty urinating.  Musculoskeletal: Negative for myalgias, back pain, joint swelling, arthralgias and neck stiffness.  Skin: Negative for pallor and rash.   Neurological: Negative for dizziness, speech difficulty and weakness.  Hematological: Negative for adenopathy. Does not bruise/bleed easily.  Psychiatric/Behavioral: Negative for confusion and sleep disturbance. The patient is not nervous/anxious.     PE; Blood pressure 93/58, pulse 64, temperature 98.2 F (36.8 C), temperature source Oral, resp. rate 16, height 5' 1.5" (1.562 m), weight 128 lb 8 oz (58.287 kg), last menstrual period 01/19/2016, SpO2 99 %.  Pt examined with Wallace Keller, CMA, as chaperone.  Gen: Alert, well appearing.  Patient is oriented to person, place, time, and situation. AFFECT: pleasant, lucid thought and speech. ENT: Ears: EACs clear, normal epithelium.  TMs with good light reflex and landmarks bilaterally.  Eyes: no injection, icteris, swelling, or exudate.  EOMI, PERRLA. Nose: no drainage or turbinate edema/swelling.  No injection or focal lesion.  Mouth: lips without lesion/swelling.  Oral mucosa pink and moist.  Dentition intact and without obvious caries or gingival swelling.  Oropharynx without erythema, exudate, or swelling.  Neck: supple/nontender.  No LAD, mass, or TM.  Carotid pulses 2+ bilaterally, without bruits. CV: RRR, no m/r/g.   LUNGS: CTA bilat, nonlabored resps, good aeration in all lung fields. ABD: soft, NT, ND, BS normal.  No hepatospenomegaly or mass.  No bruits. EXT: no clubbing, cyanosis, or edema.  Musculoskeletal: no joint swelling, erythema, warmth, or tenderness.  ROM of all joints intact. Skin - no sores or suspicious lesions or rashes or color changes  Pertinent labs:  Lab Results  Component Value Date   TSH 1.18 01/18/2016   Lab Results  Component Value Date   WBC 5.7 01/18/2016   HGB 13.1 01/18/2016   HCT 39.0 01/18/2016   MCV 87.8 01/18/2016   PLT 338.0 01/18/2016   Lab Results  Component Value Date   CREATININE 0.73 01/18/2016   BUN 10 01/18/2016   NA 139 01/18/2016   K 4.0 01/18/2016   CL 108 01/18/2016   CO2 23  01/18/2016  Glucose 94  Lab Results  Component Value Date   ALT 15 01/18/2016   AST 14 01/18/2016   ALKPHOS 56 01/18/2016   BILITOT 0.4 01/18/2016   Lab Results  Component Value Date   CHOL 174 01/18/2016   Lab Results  Component Value Date   HDL 49.20 01/18/2016   Lab Results  Component Value Date   LDLCALC 107* 01/18/2016   Lab Results  Component Value Date   TRIG 88.0 01/18/2016   Lab Results  Component Value Date  CHOLHDL 4 01/18/2016   ASSESSMENT AND PLAN:   1) Menstrual migraines: trial of maxalt .  Therapeutic expectations and side effect profile of medication discussed today.  Patient's questions answered.  2) Health maintenance exam:  Reviewed age and gender appropriate health maintenance issues (prudent diet, regular exercise, health risks of tobacco and excessive alcohol, use of seatbelts, fire alarms in home, use of sunscreen).  Also reviewed age and gender appropriate health screening as well as vaccine recommendations. Reviewed fasting HP labs in detail with pt--reassured her all looked great. Instructions: Make an appointment with Dr. Vincente Poli, your GYN MD, for pelvic exam/pap smear. Make an appointment to get your mammogram next month.  An After Visit Summary was printed and given to the patient.  FOLLOW UP:  Return in about 6 months (around 07/21/2016) for f/u migraines.  Signed:  Santiago Bumpers, MD           01/24/2016

## 2016-01-22 NOTE — Progress Notes (Signed)
Pre visit review using our clinic review tool, if applicable. No additional management support is needed unless otherwise documented below in the visit note. 

## 2016-01-22 NOTE — Patient Instructions (Signed)
Make an appointment with Dr. Vincente Poli, your GYN MD, for pelvic exam/pap smear. Make an appointment to get your mammogram next month.

## 2016-08-11 ENCOUNTER — Ambulatory Visit (HOSPITAL_BASED_OUTPATIENT_CLINIC_OR_DEPARTMENT_OTHER): Payer: 59

## 2016-08-11 ENCOUNTER — Encounter: Payer: Self-pay | Admitting: Family Medicine

## 2016-08-11 ENCOUNTER — Ambulatory Visit (INDEPENDENT_AMBULATORY_CARE_PROVIDER_SITE_OTHER): Payer: 59 | Admitting: Family Medicine

## 2016-08-11 VITALS — BP 108/69 | HR 55 | Temp 98.7°F | Resp 18 | Wt 130.0 lb

## 2016-08-11 DIAGNOSIS — R1013 Epigastric pain: Secondary | ICD-10-CM

## 2016-08-11 DIAGNOSIS — R1084 Generalized abdominal pain: Secondary | ICD-10-CM

## 2016-08-11 DIAGNOSIS — K2970 Gastritis, unspecified, without bleeding: Secondary | ICD-10-CM | POA: Diagnosis not present

## 2016-08-11 LAB — COMPREHENSIVE METABOLIC PANEL
ALT: 23 U/L (ref 0–35)
AST: 15 U/L (ref 0–37)
Albumin: 4.2 g/dL (ref 3.5–5.2)
Alkaline Phosphatase: 69 U/L (ref 39–117)
BUN: 12 mg/dL (ref 6–23)
CO2: 27 mEq/L (ref 19–32)
Calcium: 9.1 mg/dL (ref 8.4–10.5)
Chloride: 109 mEq/L (ref 96–112)
Creatinine, Ser: 0.74 mg/dL (ref 0.40–1.20)
GFR: 91.12 mL/min (ref >60.00)
GLUCOSE: 79 mg/dL (ref 70–99)
Potassium: 4.3 mEq/L (ref 3.5–5.1)
SODIUM: 141 meq/L (ref 135–145)
Total Bilirubin: 0.3 mg/dL (ref 0.2–1.2)
Total Protein: 6.7 g/dL (ref 6.0–8.3)

## 2016-08-11 LAB — CBC WITH DIFFERENTIAL/PLATELET
Basophils Absolute: 0 10*3/uL (ref 0.0–0.1)
Basophils Relative: 0.4 % (ref 0.0–3.0)
Eosinophils Absolute: 0.2 10*3/uL (ref 0.0–0.7)
Eosinophils Relative: 3.8 % (ref 0.0–5.0)
HCT: 39.3 % (ref 36.0–46.0)
Hemoglobin: 13.6 g/dL (ref 12.0–15.0)
Lymphocytes Relative: 46.8 % — ABNORMAL HIGH (ref 12.0–46.0)
Lymphs Abs: 3 10*3/uL (ref 0.7–4.0)
MCHC: 34.6 g/dL (ref 30.0–36.0)
MCV: 86.6 fl (ref 78.0–100.0)
MONO ABS: 0.7 10*3/uL (ref 0.1–1.0)
MONOS PCT: 10.5 % (ref 3.0–12.0)
NEUTROS ABS: 2.4 10*3/uL (ref 1.4–7.7)
Neutrophils Relative %: 38.5 % — ABNORMAL LOW (ref 43.0–77.0)
PLATELETS: 346 10*3/uL (ref 150.0–400.0)
RBC: 4.53 Mil/uL (ref 3.87–5.11)
RDW: 13.1 % (ref 11.5–15.5)
WBC: 6.4 10*3/uL (ref 4.0–10.5)

## 2016-08-11 LAB — LIPASE: Lipase: 24 U/L (ref 11.0–59.0)

## 2016-08-11 LAB — H. PYLORI ANTIBODY, IGG: H Pylori IgG: NEGATIVE

## 2016-08-11 MED ORDER — PANTOPRAZOLE SODIUM 40 MG PO TBEC: 40.0000 mg | DELAYED_RELEASE_TABLET | Freq: Every day | ORAL | 1 refills | Status: DC

## 2016-08-11 MED ORDER — HYOSCYAMINE SULFATE 0.125 MG SL SUBL: SUBLINGUAL_TABLET | SUBLINGUAL | 1 refills | Status: DC

## 2016-08-11 NOTE — Patient Instructions (Signed)
Buy otc generic zantac 150mg  and take one tab every night before bed for the next 2 weeks.

## 2016-08-11 NOTE — Progress Notes (Signed)
OFFICE VISIT  08/11/2016   CC:  Chief Complaint  Patient presents with  . Abdominal Pain    cramping,feels like gas   HPI:    Patient is a 45 y.o.  female who presents for abdominal pain. Onset 5 d/a, diffuse upper abd pain, sometimes "intolerable" and unable to sleep last night.   Constant nagging pain, worse after eating.  Radiates around sides into back when it really cramps.  No nausea/vomiting.   More burping lately but no excessive gas per rectum.  Feels bloated.  NO constipation, no melena or hematochezia.   Ibuprofen tried and helped a little.  Otherwise no hx of chronic NSAID use. No lower abd pain.  No fevers.   No recent diet changes.  She drinks no alcohol.  ROS: LMP: started today, as expected for her usual cycle. Nasal congestion/mucous lately.  No urinary sx's.    Past Medical History:  Diagnosis Date  . Anemia 10/30/2012  . CTS (carpal tunnel syndrome)   . Hyperlipidemia   . PCO (polycystic ovaries)    conceived on metformin  . Vestibular neuronitis 07/27/14   ED visit for vertigo: CT angio neck normal  . Vitamin D deficiency 10/30/2012    Past Surgical History:  Procedure Laterality Date  . CESAREAN SECTION  2009 &2002    Outpatient Medications Prior to Visit  Medication Sig Dispense Refill  . acetaminophen (TYLENOL) 500 MG tablet Take 1,000 mg by mouth every 6 (six) hours as needed for mild pain.    . cetirizine (ZYRTEC) 10 MG tablet Take 10 mg by mouth daily as needed for allergies.    . rizatriptan (MAXALT) 10 MG tablet Take 1 tablet (10 mg total) by mouth as needed for migraine. May repeat in 2 hours if needed (Patient not taking: Reported on 08/11/2016) 10 tablet 3   No facility-administered medications prior to visit.     No Known Allergies  ROS As per HPI  PE: Blood pressure 108/69, pulse (!) 55, temperature 98.7 F (37.1 C), resp. rate 18, weight 130 lb (59 kg), last menstrual period 08/11/2016. Gen: Alert, well appearing.  Patient is  oriented to person, place, time, and situation. AFFECT: pleasant, lucid thought and speech. WGN:FAOZENT:Eyes: no injection, icteris, swelling, or exudate.  EOMI, PERRLA. Mouth: lips without lesion/swelling.  Oral mucosa pink and moist. Oropharynx without erythema, exudate, or swelling.  CV: RRR, no m/r/g.   LUNGS: CTA bilat, nonlabored resps, good aeration in all lung fields. ABD: soft, nondistended.  BS normal.  No mass, HSM, or bruit.  Diffuse abd TTP, most significantly in mid epigastric region.  No guarding or rebound tenderness. EXT: no clubbing, cyanosis, or edema.  SkIN: no jaundice or pallor  LABS:  Lab Results  Component Value Date   TSH 1.18 01/18/2016   Lab Results  Component Value Date   WBC 5.7 01/18/2016   HGB 13.1 01/18/2016   HCT 39.0 01/18/2016   MCV 87.8 01/18/2016   PLT 338.0 01/18/2016   Lab Results  Component Value Date   CREATININE 0.73 01/18/2016   BUN 10 01/18/2016   NA 139 01/18/2016   K 4.0 01/18/2016   CL 108 01/18/2016   CO2 23 01/18/2016   Lab Results  Component Value Date   ALT 15 01/18/2016   AST 14 01/18/2016   ALKPHOS 56 01/18/2016   BILITOT 0.4 01/18/2016   Lab Results  Component Value Date   CHOL 174 01/18/2016   Lab Results  Component Value Date  HDL 49.20 01/18/2016   Lab Results  Component Value Date   LDLCALC 107 (H) 01/18/2016   Lab Results  Component Value Date   TRIG 88.0 01/18/2016   Lab Results  Component Value Date   CHOLHDL 4 01/18/2016    IMPRESSION AND PLAN:  Epigastric pain, worse postprandially.  Gastritis suspected, but want to r/o cholelithiasis/cholecystitis.  She does not have signs of acute abdomen. Will order abdominal ultrasound for today.  Check CBC, CMET, H pylori Ab, and lipase today as well. Start pantoprazole 40mg  q AM and otc zantac 150mg  qhs. Levsin 0.125mg , 1-2 tabs q6h prn abd cramping.  An After Visit Summary was printed and given to the patient.  FOLLOW UP: Return in about 2 weeks  (around 08/25/2016) for f/u epigastric pain.  Signed:  Santiago Bumpers, MD           08/11/2016

## 2016-08-12 ENCOUNTER — Ambulatory Visit (HOSPITAL_BASED_OUTPATIENT_CLINIC_OR_DEPARTMENT_OTHER): Admission: RE | Admit: 2016-08-12 | Payer: 59 | Source: Ambulatory Visit

## 2016-08-16 ENCOUNTER — Ambulatory Visit (HOSPITAL_BASED_OUTPATIENT_CLINIC_OR_DEPARTMENT_OTHER): Payer: 59

## 2016-08-24 ENCOUNTER — Ambulatory Visit: Payer: 59 | Admitting: Family Medicine

## 2017-02-01 ENCOUNTER — Ambulatory Visit: Payer: 59

## 2017-02-03 ENCOUNTER — Ambulatory Visit (INDEPENDENT_AMBULATORY_CARE_PROVIDER_SITE_OTHER): Payer: 59

## 2017-02-03 DIAGNOSIS — Z23 Encounter for immunization: Secondary | ICD-10-CM

## 2017-09-22 ENCOUNTER — Encounter: Payer: Self-pay | Admitting: Family Medicine

## 2017-09-22 ENCOUNTER — Ambulatory Visit (INDEPENDENT_AMBULATORY_CARE_PROVIDER_SITE_OTHER): Payer: 59 | Admitting: Family Medicine

## 2017-09-22 ENCOUNTER — Ambulatory Visit: Payer: 59 | Admitting: Family Medicine

## 2017-09-22 VITALS — BP 106/67 | HR 77 | Temp 98.6°F | Resp 16 | Ht 61.5 in | Wt 134.0 lb

## 2017-09-22 DIAGNOSIS — J069 Acute upper respiratory infection, unspecified: Secondary | ICD-10-CM

## 2017-09-22 DIAGNOSIS — R590 Localized enlarged lymph nodes: Secondary | ICD-10-CM | POA: Diagnosis not present

## 2017-09-22 DIAGNOSIS — B9789 Other viral agents as the cause of diseases classified elsewhere: Secondary | ICD-10-CM

## 2017-09-22 DIAGNOSIS — R059 Cough, unspecified: Secondary | ICD-10-CM

## 2017-09-22 DIAGNOSIS — R05 Cough: Secondary | ICD-10-CM

## 2017-09-22 DIAGNOSIS — J029 Acute pharyngitis, unspecified: Secondary | ICD-10-CM | POA: Diagnosis not present

## 2017-09-22 LAB — POCT RAPID STREP A (OFFICE): RAPID STREP A SCREEN: NEGATIVE

## 2017-09-22 LAB — POC INFLUENZA A&B (BINAX/QUICKVUE)
INFLUENZA A, POC: NEGATIVE
INFLUENZA B, POC: NEGATIVE

## 2017-09-22 MED ORDER — HYDROCODONE-HOMATROPINE 5-1.5 MG/5ML PO SYRP: 5.0000 mL | ORAL_SOLUTION | Freq: Three times a day (TID) | ORAL | 0 refills | Status: DC | PRN

## 2017-09-22 NOTE — Patient Instructions (Signed)
Get otc generic robitussin DM OR Mucinex DM and use as directed on the packaging for cough and congestion. Use otc generic saline nasal spray 2-3 times per day to irrigate/moisturize your nasal passages.  Take two 500 mg tylenol every 6 hours as needed for throat pain. Gargle with warm salt water several times a day to help with throat pain.  Chloraseptic spray over the counter is also an option to help sore throat.

## 2017-09-22 NOTE — Progress Notes (Signed)
OFFICE VISIT  09/22/2017   CC:  Chief Complaint  Patient presents with  . Sore Throat  . Cough  . Nasal Congestion   HPI:    Patient is a 46 y.o.  female who presents for cough. Onset 3 d/a, started with ST, dry coughing, now with a touch of stuffy nose. No fever.  HA, feeling mild malaise but no body aches.  No SOB, no tightness in chest, no wheezing. Sx's getting progressively worse.  Tried tylenol cold this morning.  Helped a little. Eating and drinking fine.  Mild stomach ache.  No diarrhea.  No rash.   Past Medical History:  Diagnosis Date  . Anemia 10/30/2012  . CTS (carpal tunnel syndrome)   . Hyperlipidemia   . PCO (polycystic ovaries)    conceived on metformin  . Vestibular neuronitis 07/27/14   ED visit for vertigo: CT angio neck normal  . Vitamin D deficiency 10/30/2012    Past Surgical History:  Procedure Laterality Date  . CESAREAN SECTION  2009 &2002    Outpatient Medications Prior to Visit  Medication Sig Dispense Refill  . acetaminophen (TYLENOL) 500 MG tablet Take 1,000 mg by mouth every 6 (six) hours as needed for mild pain.    . cetirizine (ZYRTEC) 10 MG tablet Take 10 mg by mouth daily as needed for allergies.    . hyoscyamine (LEVSIN SL) 0.125 MG SL tablet 1-2 tabs po q6h prn abd cramping (Patient not taking: Reported on 09/22/2017) 30 tablet 1  . pantoprazole (PROTONIX) 40 MG tablet Take 1 tablet (40 mg total) by mouth daily. (Patient not taking: Reported on 09/22/2017) 30 tablet 1  . rizatriptan (MAXALT) 10 MG tablet Take 1 tablet (10 mg total) by mouth as needed for migraine. May repeat in 2 hours if needed (Patient not taking: Reported on 08/11/2016) 10 tablet 3   No facility-administered medications prior to visit.     No Known Allergies  ROS As per HPI  PE: Blood pressure 106/67, pulse 77, temperature 98.6 F (37 C), temperature source Temporal, resp. rate 16, height 5' 1.5" (1.562 m), weight 134 lb (60.8 kg), SpO2 99 %. VS:  noted--normal. Gen: alert, NAD, NONTOXIC APPEARING. HEENT: eyes without injection, drainage, or swelling.  Ears: EACs clear, TMs with normal light reflex and landmarks.  Nose: Scant amount of clear rhinorrhea, with some dried, crusty exudate adherent to mildly injected mucosa.  No purulent d/c.  No paranasal sinus TTP.  No facial swelling.  Throat and mouth without focal lesion.  No pharyngial swelling, erythema, or exudate.   Neck: supple, very mild upper anterior cervical LAD that is mildly tender bilat.   LUNGS: CTA bilat, nonlabored resps.   CV: RRR, no m/r/g. EXT: no c/c/e SKIN: no rash   LABS:    Chemistry      Component Value Date/Time   NA 141 08/11/2016 1336   K 4.3 08/11/2016 1336   CL 109 08/11/2016 1336   CO2 27 08/11/2016 1336   BUN 12 08/11/2016 1336   CREATININE 0.74 08/11/2016 1336      Component Value Date/Time   CALCIUM 9.1 08/11/2016 1336   ALKPHOS 69 08/11/2016 1336   AST 15 08/11/2016 1336   ALT 23 08/11/2016 1336   BILITOT 0.3 08/11/2016 1336     Lab Results  Component Value Date   WBC 6.4 08/11/2016   HGB 13.6 08/11/2016   HCT 39.3 08/11/2016   MCV 86.6 08/11/2016   PLT 346.0 08/11/2016   Rapid strep:  NEG  Rapid flu A/B: NEG  IMPRESSION AND PLAN:  Viral URI/pharyngitis with cough. Rapid flu and strep both NEG today.  Sent group A strep culture today. Symptomatic care:  Get otc generic robitussin DM OR Mucinex DM and use as directed on the packaging for cough and congestion. Use otc generic saline nasal spray 2-3 times per day to irrigate/moisturize your nasal passages. Hycodan syrup; 1 tsp tid prn, #138ml.  An After Visit Summary was printed and given to the patient.  FOLLOW UP: Return if symptoms worsen or fail to improve.  Signed:  Santiago Bumpers, MD           09/22/2017

## 2017-09-24 LAB — CULTURE, GROUP A STREP
MICRO NUMBER:: 81204637
SPECIMEN QUALITY: ADEQUATE

## 2018-03-27 ENCOUNTER — Other Ambulatory Visit: Payer: Self-pay | Admitting: Family Medicine

## 2018-03-27 DIAGNOSIS — Z1231 Encounter for screening mammogram for malignant neoplasm of breast: Secondary | ICD-10-CM

## 2018-03-29 ENCOUNTER — Ambulatory Visit (HOSPITAL_BASED_OUTPATIENT_CLINIC_OR_DEPARTMENT_OTHER): Payer: 59

## 2018-03-29 ENCOUNTER — Ambulatory Visit (HOSPITAL_BASED_OUTPATIENT_CLINIC_OR_DEPARTMENT_OTHER)
Admission: RE | Admit: 2018-03-29 | Discharge: 2018-03-29 | Disposition: A | Discharge status: Home / Self Care | Payer: 59 | Source: Ambulatory Visit | Attending: Family Medicine | Admitting: Family Medicine

## 2018-03-29 DIAGNOSIS — Z1231 Encounter for screening mammogram for malignant neoplasm of breast: Secondary | ICD-10-CM | POA: Insufficient documentation

## 2018-04-25 ENCOUNTER — Encounter: Payer: 59 | Admitting: Family Medicine

## 2018-05-09 ENCOUNTER — Ambulatory Visit (INDEPENDENT_AMBULATORY_CARE_PROVIDER_SITE_OTHER): Payer: 59 | Admitting: Family Medicine

## 2018-05-09 ENCOUNTER — Encounter: Payer: Self-pay | Admitting: Family Medicine

## 2018-05-09 VITALS — BP 124/66 | HR 68 | Temp 99.0°F | Resp 16 | Ht 61.5 in | Wt 126.2 lb

## 2018-05-09 DIAGNOSIS — Z0000 Encounter for general adult medical examination without abnormal findings: Secondary | ICD-10-CM

## 2018-05-09 DIAGNOSIS — Z124 Encounter for screening for malignant neoplasm of cervix: Secondary | ICD-10-CM | POA: Diagnosis not present

## 2018-05-09 NOTE — Patient Instructions (Signed)

## 2018-05-09 NOTE — Progress Notes (Signed)
Office Note 05/09/2018  CC:  Chief Complaint  Patient presents with  . Annual Exam    Pt is not fasting.    HPI:  Katie Beck is a 47 y.o.  female who is here for annual health maintenance exam.  Exercise: walks 2 miles qod. DIET: trying to limit carbs more.  She is significantly afraid of developing diabetes due to her strong FH. Dental: preventatives UTD. Eyes: last visit 2 yrs ago.  C/o months hx of fatigue: began after she started dieting. Snores, but no known apnea.  NO excessive daytimg sleepiness. Stress causes her to have insomnia: 6 hours per night of sleep avg.  Pushing herself hard at work. Oldest daughter applying for college and this is stressful. Wakes up in night 1-2 times. Denies depression.  Past Medical History:  Diagnosis Date  . Anemia 10/30/2012  . CTS (carpal tunnel syndrome)   . Hyperlipidemia   . PCO (polycystic ovaries)    conceived on metformin  . Vestibular neuronitis 07/27/14   ED visit for vertigo: CT angio neck normal  . Vitamin D deficiency 10/30/2012    Past Surgical History:  Procedure Laterality Date  . CESAREAN SECTION  2009 &2002    Family History  Problem Relation Age of Onset  . Diabetes Father   . Hypertension Father   . Hypertension Sister   . Diabetes Sister   . Hypertension Mother   . Diabetes Mother   . Hyperlipidemia Mother   . Thyroid disease Mother   . Diabetes Maternal Grandfather     Social History   Socioeconomic History  . Marital status: Married    Spouse name: Not on file  . Number of children: Not on file  . Years of education: Not on file  . Highest education level: Not on file  Occupational History  . Not on file  Social Needs  . Financial resource strain: Not on file  . Food insecurity:    Worry: Not on file    Inability: Not on file  . Transportation needs:    Medical: Not on file    Non-medical: Not on file  Tobacco Use  . Smoking status: Never Smoker  . Smokeless tobacco: Never  Used  Substance and Sexual Activity  . Alcohol use: No  . Drug use: No  . Sexual activity: Yes  Lifestyle  . Physical activity:    Days per week: Not on file    Minutes per session: Not on file  . Stress: Not on file  Relationships  . Social connections:    Talks on phone: Not on file    Gets together: Not on file    Attends religious service: Not on file    Active member of club or organization: Not on file    Attends meetings of clubs or organizations: Not on file    Relationship status: Not on file  . Intimate partner violence:    Fear of current or ex partner: Not on file    Emotionally abused: Not on file    Physically abused: Not on file    Forced sexual activity: Not on file  Other Topics Concern  . Not on file  Social History Narrative   [place] origin        works outside [employer]   computor prof  masters level education      HH of 4 (45 y/o, 45 y/o + married).         pets no  NO ets or tobacco.         Vegetarian      Smoking Status:  never      Caffeine use/day:  3-4      Does Patient Exercise:  no          Outpatient Medications Prior to Visit  Medication Sig Dispense Refill  . acetaminophen (TYLENOL) 500 MG tablet Take 1,000 mg by mouth every 6 (six) hours as needed for mild pain.    . cetirizine (ZYRTEC) 10 MG tablet Take 10 mg by mouth daily as needed for allergies.    Marland Kitchen HYDROcodone-homatropine (HYCODAN) 5-1.5 MG/5ML syrup Take 5 mLs by mouth every 8 (eight) hours as needed for cough. (Patient not taking: Reported on 05/09/2018) 120 mL 0   No facility-administered medications prior to visit.     No Known Allergies  ROS Review of Systems  Constitutional: Negative for appetite change, chills, fatigue (as per hpi) and fever.  HENT: Negative for congestion, dental problem, ear pain and sore throat.   Eyes: Negative for discharge, redness and visual disturbance.  Respiratory: Negative for cough, chest tightness, shortness of breath and  wheezing.   Cardiovascular: Negative for chest pain, palpitations and leg swelling.  Gastrointestinal: Negative for abdominal pain, blood in stool, diarrhea, nausea and vomiting.  Genitourinary: Negative for difficulty urinating, dysuria, flank pain, frequency, hematuria and urgency.  Musculoskeletal: Negative for arthralgias, back pain, joint swelling, myalgias and neck stiffness.  Skin: Negative for pallor and rash.  Neurological: Negative for dizziness, speech difficulty, weakness and headaches.  Hematological: Negative for adenopathy. Does not bruise/bleed easily.  Psychiatric/Behavioral: Negative for confusion and sleep disturbance. The patient is not nervous/anxious.     PE; Blood pressure 124/66, pulse 68, temperature 99 F (37.2 C), temperature source Oral, resp. rate 16, height 5' 1.5" (1.562 m), weight 126 lb 4 oz (57.3 kg), last menstrual period 04/15/2018, SpO2 100 %. Body mass index is 23.47 kg/m. Exam chaperoned by Vanetta Mulders, CMA.  Gen: Alert, well appearing.  Patient is oriented to person, place, time, and situation. AFFECT: pleasant, lucid thought and speech. ENT: Ears: EACs clear, normal epithelium.  TMs with good light reflex and landmarks bilaterally.  Eyes: no injection, icteris, swelling, or exudate.  EOMI, PERRLA. Nose: no drainage or turbinate edema/swelling.  No injection or focal lesion.  Mouth: lips without lesion/swelling.  Oral mucosa pink and moist.  Dentition intact and without obvious caries or gingival swelling.  Oropharynx without erythema, exudate, or swelling.  Neck: supple/nontender.  No LAD, mass, or TM.  Carotid pulses 2+ bilaterally, without bruits. CV: RRR, no m/r/g.   LUNGS: CTA bilat, nonlabored resps, good aeration in all lung fields. ABD: soft, NT, ND, BS normal.  No hepatospenomegaly or mass.  No bruits. EXT: no clubbing, cyanosis, or edema.  Musculoskeletal: no joint swelling, erythema, warmth, or tenderness.  ROM of all joints intact. Skin -  no sores or suspicious lesions or rashes or color changes   Pertinent labs:  Lab Results  Component Value Date   TSH 1.18 01/18/2016   Lab Results  Component Value Date   WBC 6.4 08/11/2016   HGB 13.6 08/11/2016   HCT 39.3 08/11/2016   MCV 86.6 08/11/2016   PLT 346.0 08/11/2016   Lab Results  Component Value Date   CREATININE 0.74 08/11/2016   BUN 12 08/11/2016   NA 141 08/11/2016   K 4.3 08/11/2016   CL 109 08/11/2016   CO2 27 08/11/2016   Lab Results  Component Value Date   ALT 23 08/11/2016   AST 15 08/11/2016   ALKPHOS 69 08/11/2016   BILITOT 0.3 08/11/2016   Lab Results  Component Value Date   CHOL 174 01/18/2016   Lab Results  Component Value Date   HDL 49.20 01/18/2016   Lab Results  Component Value Date   LDLCALC 107 (H) 01/18/2016   Lab Results  Component Value Date   TRIG 88.0 01/18/2016   Lab Results  Component Value Date   CHOLHDL 4 01/18/2016    ASSESSMENT AND PLAN:   Health maintenance exam: Reviewed age and gender appropriate health maintenance issues (prudent diet, regular exercise, health risks of tobacco and excessive alcohol, use of seatbelts, fire alarms in home, use of sunscreen).  Also reviewed age and gender appropriate health screening as well as vaccine recommendations. Vaccines: UTD. Labs: fasting [facility] today-->future (not fasting today). Cervical ca screening: GYN was Dr. Edwena Bunde, now pt asks for referral to GYN office at [facility] -->referral ordered today. Breast ca screening: mammogram 03/30/18 normal--UTD. Colon ca screening:average risk patient= start screening at age 48 yrs.  I certainly feel like her fatigue is related to stressful work/home life, possibly change in diet to much lower carb --seems to have coincided with these things.  No sx's/signs of OSA.  NO depression. Discussed increase in exercise and better sleep hygiene.  An After Visit Summary was printed and given to the patient.  FOLLOW UP:   Return in about 1 year (around 05/10/2019) for annual CPE (fasting)--make appt for fasting labs at earliest convenience.   Signed:  Santiago Bumpers, MD           05/09/2018

## 2018-06-11 ENCOUNTER — Encounter: Payer: 59 | Admitting: Obstetrics & Gynecology

## 2018-07-06 ENCOUNTER — Other Ambulatory Visit (INDEPENDENT_AMBULATORY_CARE_PROVIDER_SITE_OTHER): Payer: 59

## 2018-07-06 ENCOUNTER — Other Ambulatory Visit: Payer: 59

## 2018-07-06 DIAGNOSIS — Z0000 Encounter for general adult medical examination without abnormal findings: Secondary | ICD-10-CM | POA: Diagnosis not present

## 2018-07-06 LAB — CBC WITH DIFFERENTIAL/PLATELET
BASOS ABS: 0 10*3/uL (ref 0.0–0.1)
Basophils Relative: 0.3 % (ref 0.0–3.0)
EOS PCT: 0.8 % (ref 0.0–5.0)
Eosinophils Absolute: 0.1 10*3/uL (ref 0.0–0.7)
HCT: 43.2 % (ref 36.0–46.0)
Hemoglobin: 14.5 g/dL (ref 12.0–15.0)
LYMPHS ABS: 3 10*3/uL (ref 0.7–4.0)
Lymphocytes Relative: 43.9 % (ref 12.0–46.0)
MCHC: 33.5 g/dL (ref 30.0–36.0)
MCV: 88.4 fl (ref 78.0–100.0)
MONOS PCT: 8.4 % (ref 3.0–12.0)
Monocytes Absolute: 0.6 10*3/uL (ref 0.1–1.0)
NEUTROS ABS: 3.2 10*3/uL (ref 1.4–7.7)
NEUTROS PCT: 46.6 % (ref 43.0–77.0)
PLATELETS: 333 10*3/uL (ref 150.0–400.0)
RBC: 4.88 Mil/uL (ref 3.87–5.11)
RDW: 13.4 % (ref 11.5–15.5)
WBC: 6.8 10*3/uL (ref 4.0–10.5)

## 2018-07-06 LAB — LIPID PANEL
Cholesterol: 173 mg/dL (ref 0–200)
HDL: 49.6 mg/dL (ref >39.00)
LDL CALC: 99 mg/dL (ref 0–99)
NonHDL: 123.52
Total CHOL/HDL Ratio: 3
Triglycerides: 121 mg/dL (ref 0.0–149.0)
VLDL: 24.2 mg/dL (ref 0.0–40.0)

## 2018-07-06 LAB — COMPREHENSIVE METABOLIC PANEL
ALK PHOS: 78 U/L (ref 39–117)
ALT: 28 U/L (ref 0–35)
AST: 19 U/L (ref 0–37)
Albumin: 4.4 g/dL (ref 3.5–5.2)
BUN: 13 mg/dL (ref 6–23)
CHLORIDE: 106 meq/L (ref 96–112)
CO2: 26 mEq/L (ref 19–32)
Calcium: 9.7 mg/dL (ref 8.4–10.5)
Creatinine, Ser: 0.87 mg/dL (ref 0.40–1.20)
GFR: 74.93 mL/min (ref >60.00)
GLUCOSE: 88 mg/dL (ref 70–99)
POTASSIUM: 4.3 meq/L (ref 3.5–5.1)
Sodium: 139 mEq/L (ref 135–145)
TOTAL PROTEIN: 7.1 g/dL (ref 6.0–8.3)
Total Bilirubin: 0.8 mg/dL (ref 0.2–1.2)

## 2018-07-06 LAB — TSH: TSH: 1.94 u[IU]/mL (ref 0.35–4.50)

## 2019-08-26 ENCOUNTER — Other Ambulatory Visit (HOSPITAL_BASED_OUTPATIENT_CLINIC_OR_DEPARTMENT_OTHER): Payer: Self-pay | Admitting: Family Medicine

## 2019-08-26 DIAGNOSIS — Z1231 Encounter for screening mammogram for malignant neoplasm of breast: Secondary | ICD-10-CM

## 2019-09-09 ENCOUNTER — Encounter: Payer: Self-pay | Admitting: Obstetrics & Gynecology

## 2019-09-09 ENCOUNTER — Other Ambulatory Visit: Payer: Self-pay

## 2019-09-09 ENCOUNTER — Ambulatory Visit (INDEPENDENT_AMBULATORY_CARE_PROVIDER_SITE_OTHER): Payer: 59 | Admitting: Obstetrics & Gynecology

## 2019-09-09 VITALS — BP 106/55 | HR 64 | Resp 16 | Ht 62.0 in | Wt 129.0 lb

## 2019-09-09 DIAGNOSIS — Z1151 Encounter for screening for human papillomavirus (HPV): Secondary | ICD-10-CM

## 2019-09-09 DIAGNOSIS — Z01419 Encounter for gynecological examination (general) (routine) without abnormal findings: Secondary | ICD-10-CM

## 2019-09-09 DIAGNOSIS — Z124 Encounter for screening for malignant neoplasm of cervix: Secondary | ICD-10-CM

## 2019-09-09 DIAGNOSIS — Z23 Encounter for immunization: Secondary | ICD-10-CM

## 2019-09-09 NOTE — Patient Instructions (Signed)
Levonorgestrel intrauterine device (IUD) What is this medicine? LEVONORGESTREL IUD (LEE voe nor jes trel) is a contraceptive (birth control) device. The device is placed inside the uterus by a healthcare professional. It is used to prevent pregnancy. This device can also be used to treat heavy bleeding that occurs during your period. This medicine may be used for other purposes; ask your health care provider or pharmacist if you have questions. COMMON BRAND NAME(S): Kyleena, LILETTA, Mirena, Skyla What should I tell my health care provider before I take this medicine? They need to know if you have any of these conditions:  abnormal Pap smear  cancer of the breast, uterus, or cervix  diabetes  endometritis  genital or pelvic infection now or in the past  have more than one sexual partner or your partner has more than one partner  heart disease  history of an ectopic or tubal pregnancy  immune system problems  IUD in place  liver disease or tumor  problems with blood clots or take blood-thinners  seizures  use intravenous drugs  uterus of unusual shape  vaginal bleeding that has not been explained  an unusual or allergic reaction to levonorgestrel, other hormones, silicone, or polyethylene, medicines, foods, dyes, or preservatives  pregnant or trying to get pregnant  breast-feeding How should I use this medicine? This device is placed inside the uterus by a health care professional. Talk to your pediatrician regarding the use of this medicine in children. Special care may be needed. Overdosage: If you think you have taken too much of this medicine contact a poison control center or emergency room at once. NOTE: This medicine is only for you. Do not share this medicine with others. What if I miss a dose? This does not apply. Depending on the brand of device you have inserted, the device will need to be replaced every 3 to 6 years if you wish to continue using this type  of birth control. What may interact with this medicine? Do not take this medicine with any of the following medications:  amprenavir  bosentan  fosamprenavir This medicine may also interact with the following medications:  aprepitant  armodafinil  barbiturate medicines for inducing sleep or treating seizures  bexarotene  boceprevir  griseofulvin  medicines to treat seizures like carbamazepine, ethotoin, felbamate, oxcarbazepine, phenytoin, topiramate  modafinil  pioglitazone  rifabutin  rifampin  rifapentine  some medicines to treat HIV infection like atazanavir, efavirenz, indinavir, lopinavir, nelfinavir, tipranavir, ritonavir  St. John's wort  warfarin This list may not describe all possible interactions. Give your health care provider a list of all the medicines, herbs, non-prescription drugs, or dietary supplements you use. Also tell them if you smoke, drink alcohol, or use illegal drugs. Some items may interact with your medicine. What should I watch for while using this medicine? Visit your doctor or health care professional for regular check ups. See your doctor if you or your partner has sexual contact with others, becomes HIV positive, or gets a sexual transmitted disease. This product does not protect you against HIV infection (AIDS) or other sexually transmitted diseases. You can check the placement of the IUD yourself by reaching up to the top of your vagina with clean fingers to feel the threads. Do not pull on the threads. It is a good habit to check placement after each menstrual period. Call your doctor right away if you feel more of the IUD than just the threads or if you cannot feel the threads at   all. The IUD may come out by itself. You may become pregnant if the device comes out. If you notice that the IUD has come out use a backup birth control method like condoms and call your health care provider. Using tampons will not change the position of the  IUD and are okay to use during your period. This IUD can be safely scanned with magnetic resonance imaging (MRI) only under specific conditions. Before you have an MRI, tell your healthcare provider that you have an IUD in place, and which type of IUD you have in place. What side effects may I notice from receiving this medicine? Side effects that you should report to your doctor or health care professional as soon as possible:  allergic reactions like skin rash, itching or hives, swelling of the face, lips, or tongue  fever, flu-like symptoms  genital sores  high blood pressure  no menstrual period for 6 weeks during use  pain, swelling, warmth in the leg  pelvic pain or tenderness  severe or sudden headache  signs of pregnancy  stomach cramping  sudden shortness of breath  trouble with balance, talking, or walking  unusual vaginal bleeding, discharge  yellowing of the eyes or skin Side effects that usually do not require medical attention (report to your doctor or health care professional if they continue or are bothersome):  acne  breast pain  change in sex drive or performance  changes in weight  cramping, dizziness, or faintness while the device is being inserted  headache  irregular menstrual bleeding within first 3 to 6 months of use  nausea This list may not describe all possible side effects. Call your doctor for medical advice about side effects. You may report side effects to FDA at 1-800-FDA-1088. Where should I keep my medicine? This does not apply. NOTE: This sheet is a summary. It may not cover all possible information. If you have questions about this medicine, talk to your doctor, pharmacist, or health care provider.  2020 Elsevier/Gold Standard (2018-09-25 13:22:01)  

## 2019-09-09 NOTE — Progress Notes (Signed)
Subjective:     Katie Beck is a 48 y.o. female here for a routine exam.  Current complaints: husband with decreased libido--very tired and not sleeping well. Pt does not use birth control other than coitus interruptus.  Pt interested in more frequent intercourse.  Pt also interested in birth control so that she is not nervous about unintended pregnancy during intercourse.    Gynecologic History Patient's last menstrual period was 08/14/2019. Contraception: coitus interruptus Last Pap: about 5 years ago. Results were: normal Last mammogram: May 2019. Results were: normal  Obstetric History OB History  Gravida Para Term Preterm AB Living  2 2 2         SAB TAB Ectopic Multiple Live Births               # Outcome Date GA Lbr Len/2nd Weight Sex Delivery Anes PTL Lv  2 Term      CS-LTranv     1 Term      CS-LTranv        The following portions of the patient's history were reviewed and updated as appropriate: allergies, current medications, past family history, past medical history, past social history, past surgical history and problem list.  Review of Systems Pertinent items noted in HPI and remainder of comprehensive ROS otherwise negative.    Objective:      Vitals:   09/09/19 1005  BP: (!) 106/55  Pulse: 64  Resp: 16  Weight: 129 lb (58.5 kg)  Height: 5\' 2"  (1.575 m)   Vitals:  WNL General appearance: alert, cooperative and no distress  HEENT: Normocephalic, without obvious abnormality, atraumatic Eyes: negative Throat: lips, mucosa, and tongue normal; teeth and gums normal  Respiratory: Clear to auscultation bilaterally  CV: Regular rate and rhythm  Breasts:  Normal appearance, no masses or tenderness, no nipple retraction or dimpling  GI: Soft, non-tender; bowel sounds normal; no masses,  no organomegaly  GU: External Genitalia:  Tanner V, no lesion Urethra:  No prolapse   Vagina: Pink, normal rugae, no blood or discharge  Cervix: No CMT, no lesion  Uterus:   Normal size and contour, non tender  Adnexa: Normal, no masses, non tender  Musculoskeletal: No edema, redness or tenderness in the calves or thighs  Skin: No lesions or rash  Lymphatic: Axillary adenopathy: none     Psychiatric: Normal mood and behavior        Assessment:    Healthy female exam.    Plan:   1.  Pap with co testing 2.  Yearly Mammograms 3.  Flu shot 4.  Husband should go to PCP to physical evaluation of fatigue.  Couples counseling could be beneficial as well to discuss intercourse frequency and female decreased libido.   5.  Pt considering Mirena IUD.  Will need protected intercourse and negative pregnancy test before IUD insertion.

## 2019-09-13 LAB — CYTOLOGY - PAP
Comment: NEGATIVE
Diagnosis: NEGATIVE
High risk HPV: NEGATIVE

## 2019-09-19 ENCOUNTER — Encounter: Payer: 59 | Admitting: Family Medicine

## 2019-09-26 ENCOUNTER — Other Ambulatory Visit: Payer: Self-pay

## 2019-09-26 ENCOUNTER — Ambulatory Visit (INDEPENDENT_AMBULATORY_CARE_PROVIDER_SITE_OTHER): Payer: 59 | Admitting: Family Medicine

## 2019-09-26 ENCOUNTER — Encounter: Payer: Self-pay | Admitting: Family Medicine

## 2019-09-26 VITALS — BP 109/71 | HR 70 | Temp 98.3°F | Resp 16 | Ht 62.0 in | Wt 129.2 lb

## 2019-09-26 DIAGNOSIS — E282 Polycystic ovarian syndrome: Secondary | ICD-10-CM

## 2019-09-26 DIAGNOSIS — Z0000 Encounter for general adult medical examination without abnormal findings: Secondary | ICD-10-CM | POA: Diagnosis not present

## 2019-09-26 DIAGNOSIS — E7800 Pure hypercholesterolemia, unspecified: Secondary | ICD-10-CM

## 2019-09-26 NOTE — Progress Notes (Signed)
Office Note 09/27/2019  CC:  Chief Complaint  Patient presents with  . Annual Exam    pt is fasting    HPI:  Katie Beck is a 48 y.o. female who is here for annual health maintenance exam.  Feeling well. Working some on improved diet and exercise habits. No acute complaints.  Past Medical History:  Diagnosis Date  . Anemia 10/30/2012  . CTS (carpal tunnel syndrome)   . Hyperlipidemia   . PCO (polycystic ovaries)    conceived on metformin  . Vestibular neuronitis 07/27/14   ED visit for vertigo: CT angio neck normal  . Vitamin D deficiency 10/30/2012    Past Surgical History:  Procedure Laterality Date  . CESAREAN SECTION  2009 &2002    Family History  Problem Relation Age of Onset  . Diabetes Father   . Hypertension Father   . Hypertension Sister   . Diabetes Sister   . Hypertension Mother   . Diabetes Mother   . Hyperlipidemia Mother   . Thyroid disease Mother   . Diabetes Maternal Grandfather   . Cancer Brother        pancreatic    Social History   Socioeconomic History  . Marital status: Married    Spouse name: Not on file  . Number of children: Not on file  . Years of education: Not on file  . Highest education level: Not on file  Occupational History  . Occupation: IT  Social Needs  . Financial resource strain: Not on file  . Food insecurity    Worry: Not on file    Inability: Not on file  . Transportation needs    Medical: Not on file    Non-medical: Not on file  Tobacco Use  . Smoking status: Never Smoker  . Smokeless tobacco: Never Used  Substance and Sexual Activity  . Alcohol use: No  . Drug use: No  . Sexual activity: Yes    Partners: Male    Birth control/protection: None  Lifestyle  . Physical activity    Days per week: Not on file    Minutes per session: Not on file  . Stress: Not on file  Relationships  . Social Musician on phone: Not on file    Gets together: Not on file    Attends religious service:  Not on file    Active member of club or organization: Not on file    Attends meetings of clubs or organizations: Not on file    Relationship status: Not on file  . Intimate partner violence    Fear of current or ex partner: Not on file    Emotionally abused: Not on file    Physically abused: Not on file    Forced sexual activity: Not on file  Other Topics Concern  . 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         pets no        NO ets or tobacco.         Vegetarian      Smoking Status:  never      Caffeine use/day:  3-4      Does Patient Exercise:  no          Outpatient Medications  Prior to Visit  Medication Sig Dispense Refill  . acetaminophen (TYLENOL) 500 MG tablet Take 1,000 mg by mouth every 6 (six) hours as needed for mild pain.    . cetirizine (ZYRTEC) 10 MG tablet Take 10 mg by mouth daily as needed for allergies.     No facility-administered medications prior to visit.     No Known Allergies  ROS Review of Systems  Constitutional: Negative for appetite change, chills, fatigue and fever.  HENT: Negative for congestion, dental problem, ear pain and sore throat.   Eyes: Negative for discharge, redness and visual disturbance.  Respiratory: Negative for cough, chest tightness, shortness of breath and wheezing.   Cardiovascular: Negative for chest pain, palpitations and leg swelling.  Gastrointestinal: Negative for abdominal pain, blood in stool, diarrhea, nausea and vomiting.  Genitourinary: Negative for difficulty urinating, dysuria, flank pain, frequency, hematuria and urgency.  Musculoskeletal: Negative for arthralgias, back pain, joint swelling, myalgias and neck stiffness.  Skin: Negative for pallor and rash.  Neurological: Negative for dizziness, speech difficulty, weakness and headaches.  Hematological: Negative for adenopathy.  Does not bruise/bleed easily.  Psychiatric/Behavioral: Negative for confusion and sleep disturbance. The patient is not nervous/anxious.     PE; Blood pressure 109/71, pulse 70, temperature 98.3 F (36.8 C), temperature source Temporal, resp. rate 16, height 5\' 2"  (1.575 m), weight 129 lb 3.2 oz (58.6 kg), SpO2 95 %. Body mass index is 23.63 kg/m. Exam chaperoned by Emi HolesBrittanae Staton, CMA.  Gen: Alert, well appearing.  Patient is oriented to person, place, time, and situation. AFFECT: pleasant, lucid thought and speech. ENT: Ears: EACs clear, normal epithelium.  TMs with good light reflex and landmarks bilaterally.  Eyes: no injection, icteris, swelling, or exudate.  EOMI, PERRLA. Nose: no drainage or turbinate edema/swelling.  No injection or focal lesion.  Mouth: lips without lesion/swelling.  Oral mucosa pink and moist.  Dentition intact and without obvious caries or gingival swelling.  Oropharynx without erythema, exudate, or swelling.  Neck: supple/nontender.  No LAD, mass, or TM.  Carotid pulses 2+ bilaterally, without bruits. CV: RRR, no m/r/g.   LUNGS: CTA bilat, nonlabored resps, good aeration in all lung fields. ABD: soft, NT, ND, BS normal.  No hepatospenomegaly or mass.  No bruits. EXT: no clubbing, cyanosis, or edema.  Musculoskeletal: no joint swelling, erythema, warmth, or tenderness.  ROM of all joints intact. Skin - no sores or suspicious lesions or rashes or color changes   Pertinent labs:  Lab Results  Component Value Date   TSH 1.94 07/06/2018   Lab Results  Component Value Date   WBC 6.8 07/06/2018   HGB 14.5 07/06/2018   HCT 43.2 07/06/2018   MCV 88.4 07/06/2018   PLT 333.0 07/06/2018   Lab Results  Component Value Date   CREATININE 0.87 07/06/2018   BUN 13 07/06/2018   NA 139 07/06/2018   K 4.3 07/06/2018   CL 106 07/06/2018   CO2 26 07/06/2018   Lab Results  Component Value Date   ALT 28 07/06/2018   AST 19 07/06/2018   ALKPHOS 78 07/06/2018    BILITOT 0.8 07/06/2018   Lab Results  Component Value Date   CHOL 173 07/06/2018   Lab Results  Component Value Date   HDL 49.60 07/06/2018   Lab Results  Component Value Date   LDLCALC 99 07/06/2018   Lab Results  Component Value Date   TRIG 121.0 07/06/2018   Lab Results  Component Value Date   CHOLHDL 3 07/06/2018  ASSESSMENT AND PLAN:   Health maintenance exam: Reviewed age and gender appropriate health maintenance issues (prudent diet, regular exercise, health risks of tobacco and excessive alcohol, use of seatbelts, fire alarms in home, use of sunscreen).  Also reviewed age and gender appropriate health screening as well as vaccine recommendations. Vaccines: UTD including seasonal flu. Labs: fasting [facility] + HbA1c->return for fasting lab visit (unable to draw blood today b/c power outage). Cervical ca screening:  Per GYN MD->UTD (09/09/19).  Normal per pt report. Breast ca screening: she has a mammogram ordered already by me and also one by her GYN MD->both ordered within the last month.  Pt to call and arrange. Colon ca screening: average risk patient= as per latest guidelines, start screening at 89 yrs of age.  An After Visit Summary was printed and given to the patient.  FOLLOW UP:  Return in about 1 year (around 09/25/2020) for annual CPE (fasting).  Signed:  Crissie Sickles, MD           09/27/2019

## 2020-01-22 ENCOUNTER — Ambulatory Visit: Payer: 59 | Admitting: Family Medicine

## 2020-01-22 ENCOUNTER — Encounter: Payer: Self-pay | Admitting: Family Medicine

## 2020-01-22 ENCOUNTER — Other Ambulatory Visit: Payer: Self-pay

## 2020-01-22 VITALS — BP 118/78 | HR 59 | Temp 98.1°F | Resp 16 | Ht 62.0 in | Wt 132.0 lb

## 2020-01-22 DIAGNOSIS — H8110 Benign paroxysmal vertigo, unspecified ear: Secondary | ICD-10-CM

## 2020-01-22 DIAGNOSIS — M542 Cervicalgia: Secondary | ICD-10-CM | POA: Diagnosis not present

## 2020-01-22 NOTE — Progress Notes (Signed)
OFFICE VISIT  01/22/2020   CC:  Chief Complaint  Patient presents with  . Dizziness    pressure in her neck, mainly in the morning when it occurs   HPI:    Patient is a 49 y.o. female who presents for dizziness. Onset about a week ago, spinning sensation first waking up and moving in bed, +nausea.  Some pain in muscles of back of neck. Stands at cpu all day, mid back and neck aches. She never takes NSAID/tylenol. Has hx of vertigo, she did epley maneuvers at home and this has helped. She feels 80-90% improved today.  Her episodes are less frequent and less intense, shorter duration.  Some light headed feeling between episodes, getting better. No illness recently.  ROS: no CP, no SOB, no wheezing, no cough, no HAs, no rashes, no melena/hematochezia.  No polyuria or polydipsia.  No myalgias or arthralgias.  Past Medical History:  Diagnosis Date  . Anemia 10/30/2012  . CTS (carpal tunnel syndrome)   . Hyperlipidemia   . PCO (polycystic ovaries)    conceived on metformin  . Vestibular neuronitis 07/27/14   ED visit for vertigo: CT angio neck normal  . Vitamin D deficiency 10/30/2012    Past Surgical History:  Procedure Laterality Date  . CESAREAN SECTION  2009 &2002    Outpatient Medications Prior to Visit  Medication Sig Dispense Refill  . acetaminophen (TYLENOL) 500 MG tablet Take 1,000 mg by mouth every 6 (six) hours as needed for mild pain.    . cetirizine (ZYRTEC) 10 MG tablet Take 10 mg by mouth daily as needed for allergies.     No facility-administered medications prior to visit.    No Known Allergies  ROS As per HPI  PE: Blood pressure 118/78, pulse (!) 59, temperature 98.1 F (36.7 C), temperature source Temporal, resp. rate 16, height 5\' 2"  (1.575 m), weight 132 lb (59.9 kg), SpO2 99 %. Gen: Alert, well appearing.  Patient is oriented to person, place, time, and situation. AFFECT: pleasant, lucid thought and speech. KDT:OIZT: no injection, icteris, swelling,  or exudate.  EOMI, PERRLA.  Ears clear, TMs normal. Mouth: lips without lesion/swelling.  Oral mucosa pink and moist. Oropharynx without erythema, exudate, or swelling.  Neck - No masses or thyromegaly or limitation in range of motion. +tight but nontender mm's in upper trap region bilat. CV: RRR, no m/r/g.   LUNGS: CTA bilat, nonlabored resps, good aeration in all lung fields. EXT: no clubbing or cyanosis.  no edema.  Neuro: no nystagmus. CN 2-12 intact bilaterally, strength 5/5 in proximal and distal upper extremities and lower extremities bilaterally.  No sensory deficits.  No tremor.  No disdiadochokinesis.  No ataxia.  Upper extremity and lower extremity DTRs symmetric.  No pronator drift. Dix-halpike: no vertigo or nystagmus elicited.    LABS:    Chemistry      Component Value Date/Time   NA 139 07/06/2018 0916   K 4.3 07/06/2018 0916   CL 106 07/06/2018 0916   CO2 26 07/06/2018 0916   BUN 13 07/06/2018 0916   CREATININE 0.87 07/06/2018 0916      Component Value Date/Time   CALCIUM 9.7 07/06/2018 0916   ALKPHOS 78 07/06/2018 0916   AST 19 07/06/2018 0916   ALT 28 07/06/2018 0916   BILITOT 0.8 07/06/2018 0916     Lab Results  Component Value Date   WBC 6.8 07/06/2018   HGB 14.5 07/06/2018   HCT 43.2 07/06/2018   MCV 88.4 07/06/2018  PLT 333.0 07/06/2018   Lab Results  Component Value Date   TSH 1.94 07/06/2018     IMPRESSION AND PLAN:  1) BPPV, nearly completely resolved now. Discussed dx, home epley maneuvers as therapy prn.  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 Discussed importance of posture, massage and heat prn, NSAID or tylenol prn. Gentle ROM of neck encouraged.  Will check c-spine x-ray to see if any signif degree of arthritis changes.  Last CPE we did not have power so she was unable to get HP labs. She comes in next week to get these done.  An After Visit Summary was printed and  given to the patient.  FOLLOW UP: No follow-ups on file.  Signed:  Santiago Bumpers, MD           01/22/2020

## 2020-01-27 ENCOUNTER — Ambulatory Visit (INDEPENDENT_AMBULATORY_CARE_PROVIDER_SITE_OTHER): Payer: 59 | Admitting: Family Medicine

## 2020-01-27 ENCOUNTER — Other Ambulatory Visit: Payer: Self-pay

## 2020-01-27 DIAGNOSIS — E282 Polycystic ovarian syndrome: Secondary | ICD-10-CM

## 2020-01-27 DIAGNOSIS — E7800 Pure hypercholesterolemia, unspecified: Secondary | ICD-10-CM

## 2020-01-27 LAB — CBC WITH DIFFERENTIAL/PLATELET
Basophils Absolute: 0 10*3/uL (ref 0.0–0.1)
Basophils Relative: 0.4 % (ref 0.0–3.0)
Eosinophils Absolute: 0.1 10*3/uL (ref 0.0–0.7)
Eosinophils Relative: 1.2 % (ref 0.0–5.0)
HCT: 41.9 % (ref 36.0–46.0)
Hemoglobin: 14.1 g/dL (ref 12.0–15.0)
Lymphocytes Relative: 32.2 % (ref 12.0–46.0)
Lymphs Abs: 2.1 10*3/uL (ref 0.7–4.0)
MCHC: 33.6 g/dL (ref 30.0–36.0)
MCV: 88 fl (ref 78.0–100.0)
Monocytes Absolute: 0.5 10*3/uL (ref 0.1–1.0)
Monocytes Relative: 7.2 % (ref 3.0–12.0)
Neutro Abs: 3.9 10*3/uL (ref 1.4–7.7)
Neutrophils Relative %: 59 % (ref 43.0–77.0)
Platelets: 284 10*3/uL (ref 150.0–400.0)
RBC: 4.76 Mil/uL (ref 3.87–5.11)
RDW: 13.5 % (ref 11.5–15.5)
WBC: 6.6 10*3/uL (ref 4.0–10.5)

## 2020-01-27 LAB — LIPID PANEL
Cholesterol: 180 mg/dL (ref 0–200)
HDL: 50.5 mg/dL (ref >39.00)
LDL Cholesterol: 113 mg/dL — ABNORMAL HIGH (ref 0–99)
NonHDL: 129.15
Total CHOL/HDL Ratio: 4
Triglycerides: 81 mg/dL (ref 0.0–149.0)
VLDL: 16.2 mg/dL (ref 0.0–40.0)

## 2020-01-27 LAB — COMPREHENSIVE METABOLIC PANEL
ALT: 29 U/L (ref 0–35)
AST: 17 U/L (ref 0–37)
Albumin: 4.2 g/dL (ref 3.5–5.2)
Alkaline Phosphatase: 71 U/L (ref 39–117)
BUN: 12 mg/dL (ref 6–23)
CO2: 25 mEq/L (ref 19–32)
Calcium: 9.6 mg/dL (ref 8.4–10.5)
Chloride: 108 mEq/L (ref 96–112)
Creatinine, Ser: 0.72 mg/dL (ref 0.40–1.20)
GFR: 87.1 mL/min (ref >60.00)
Glucose, Bld: 94 mg/dL (ref 70–99)
Potassium: 4.5 mEq/L (ref 3.5–5.1)
Sodium: 141 mEq/L (ref 135–145)
Total Bilirubin: 0.5 mg/dL (ref 0.2–1.2)
Total Protein: 6.8 g/dL (ref 6.0–8.3)

## 2020-01-27 LAB — TSH: TSH: 1.76 u[IU]/mL (ref 0.35–4.50)

## 2020-01-27 LAB — HEMOGLOBIN A1C: Hgb A1c MFr Bld: 5.3 % (ref 4.6–6.5)

## 2020-01-28 ENCOUNTER — Encounter: Payer: Self-pay | Admitting: Family Medicine

## 2020-01-29 ENCOUNTER — Ambulatory Visit: Payer: 59

## 2020-11-02 ENCOUNTER — Other Ambulatory Visit: Payer: Self-pay | Admitting: Obstetrics & Gynecology

## 2020-11-02 DIAGNOSIS — Z1231 Encounter for screening mammogram for malignant neoplasm of breast: Secondary | ICD-10-CM

## 2020-11-23 ENCOUNTER — Ambulatory Visit: Payer: 59 | Admitting: Obstetrics & Gynecology

## 2020-12-07 ENCOUNTER — Encounter: Payer: Self-pay | Admitting: Obstetrics & Gynecology

## 2020-12-07 ENCOUNTER — Other Ambulatory Visit: Payer: Self-pay

## 2020-12-07 ENCOUNTER — Ambulatory Visit (INDEPENDENT_AMBULATORY_CARE_PROVIDER_SITE_OTHER): Payer: 59 | Admitting: Obstetrics & Gynecology

## 2020-12-07 VITALS — BP 123/75 | HR 61 | Ht 62.0 in | Wt 131.0 lb

## 2020-12-07 DIAGNOSIS — Z01419 Encounter for gynecological examination (general) (routine) without abnormal findings: Secondary | ICD-10-CM

## 2020-12-07 NOTE — Progress Notes (Signed)
Subjective:     Katie Beck is a 50 y.o. female here for a routine exam.  Current complaints: menstrual cycle is 4 days, stops bleeding for a day then has 2 days spotting.  No other spotting during cycle.     Gynecologic History Patient's last menstrual period was 11/22/2020. Contraception: condoms Last Pap: 2020. Results were: normal Last mammogram: 2019. Results were: normal  Obstetric History OB History  Gravida Para Term Preterm AB Living  2 2 2         SAB IAB Ectopic Multiple Live Births               # Outcome Date GA Lbr Len/2nd Weight Sex Delivery Anes PTL Lv  2 Term      CS-LTranv     1 Term      CS-LTranv        The following portions of the patient's history were reviewed and updated as appropriate: allergies, current medications, past family history, past medical history, past social history, past surgical history and problem list.  Review of Systems Pertinent items noted in HPI and remainder of comprehensive ROS otherwise negative.    Objective:      Vitals:   12/07/20 1511  BP: 123/75  Pulse: 61  Weight: 131 lb (59.4 kg)  Height: 5\' 2"  (1.575 m)   Vitals:  WNL General appearance: alert, cooperative and no distress  HEENT: Normocephalic, without obvious abnormality, atraumatic Eyes: negative Throat: lips, mucosa, and tongue normal; teeth and gums normal  Respiratory: Clear to auscultation bilaterally  CV: Regular rate and rhythm  Breasts:  Normal appearance, no masses or tenderness, no nipple retraction or dimpling  GI: Soft, non-tender; bowel sounds normal; no masses,  no organomegaly  GU: External Genitalia:  Tanner V, no lesion Urethra:  No prolapse   Vagina: Pink, normal rugae, no blood or discharge  Cervix: No CMT, no lesion  Uterus:  Normal size and contour, non tender  Adnexa: Normal, no masses, non tender  Musculoskeletal: No edema, redness or tenderness in the calves or thighs  Skin: No lesions or rash  Lymphatic: Axillary adenopathy:  none     Psychiatric: Normal mood and behavior        Assessment:    Healthy female exam.    Plan:   1.  Keep menstrual calendar.  If bleeding occurs outside of 7-8 days of beginning of cycle, she should messae 02/04/21 through my chart. 2.  Has mammogram appt and plans to do yearly mammograms going froward. 3.  Sees PCP in March 2022.  Discussed new recommendations for colon screening.  4.  Vitamin D today.

## 2020-12-07 NOTE — Progress Notes (Signed)
Last pap-09/09/19- negative Last mam- 03/30/18- normal

## 2020-12-08 ENCOUNTER — Other Ambulatory Visit: Payer: Self-pay | Admitting: Obstetrics & Gynecology

## 2020-12-08 LAB — VITAMIN D 25 HYDROXY (VIT D DEFICIENCY, FRACTURES): Vit D, 25-Hydroxy: 12 ng/mL — ABNORMAL LOW (ref 30–100)

## 2020-12-12 ENCOUNTER — Other Ambulatory Visit: Payer: Self-pay | Admitting: Obstetrics & Gynecology

## 2020-12-12 DIAGNOSIS — E559 Vitamin D deficiency, unspecified: Secondary | ICD-10-CM

## 2020-12-12 MED ORDER — VITAMIN D (ERGOCALCIFEROL) 1.25 MG (50000 UNIT) PO CAPS: 50000.0000 [IU] | ORAL_CAPSULE | ORAL | 1 refills | Status: DC

## 2020-12-30 ENCOUNTER — Other Ambulatory Visit: Payer: Self-pay

## 2020-12-30 ENCOUNTER — Ambulatory Visit (INDEPENDENT_AMBULATORY_CARE_PROVIDER_SITE_OTHER): Payer: 59

## 2020-12-30 DIAGNOSIS — Z1231 Encounter for screening mammogram for malignant neoplasm of breast: Secondary | ICD-10-CM

## 2021-12-27 ENCOUNTER — Ambulatory Visit: Payer: 59 | Admitting: Obstetrics & Gynecology

## 2022-01-10 ENCOUNTER — Ambulatory Visit (INDEPENDENT_AMBULATORY_CARE_PROVIDER_SITE_OTHER): Payer: 59 | Admitting: Obstetrics & Gynecology

## 2022-01-10 ENCOUNTER — Encounter: Payer: Self-pay | Admitting: Obstetrics & Gynecology

## 2022-01-10 ENCOUNTER — Other Ambulatory Visit: Payer: Self-pay

## 2022-01-10 ENCOUNTER — Other Ambulatory Visit (HOSPITAL_COMMUNITY)
Admission: RE | Admit: 2022-01-10 | Discharge: 2022-01-10 | Disposition: A | Discharge status: Home / Self Care | Payer: 59 | Source: Ambulatory Visit | Attending: Obstetrics & Gynecology | Admitting: Obstetrics & Gynecology

## 2022-01-10 VITALS — BP 124/71 | HR 63 | Resp 16 | Ht 62.0 in | Wt 136.0 lb

## 2022-01-10 DIAGNOSIS — E559 Vitamin D deficiency, unspecified: Secondary | ICD-10-CM

## 2022-01-10 DIAGNOSIS — Z01419 Encounter for gynecological examination (general) (routine) without abnormal findings: Secondary | ICD-10-CM

## 2022-01-10 NOTE — Progress Notes (Signed)
Subjective:     Katie Beck is a 51 y.o. female here for a routine exam.  Current complaints: Some menstrual irregularities but mostly having 1 menstrual cycle a month.  Patient feels tired and that her hair is falling out.  She also has some vague muscle and body aches..     Gynecologic History Patient's last menstrual period was 12/31/2021. Contraception: none Last Pap: 2020. Results were: normal Last mammogram: 2/22. Results were: normal  Obstetric History OB History  Gravida Para Term Preterm AB Living  2 2 2         SAB IAB Ectopic Multiple Live Births               # Outcome Date GA Lbr Len/2nd Weight Sex Delivery Anes PTL Lv  2 Term      CS-LTranv     1 Term      CS-LTranv        The following portions of the patient's history were reviewed and updated as appropriate: allergies, current medications, past family history, past medical history, past social history, past surgical history, and problem list.  Review of Systems Pertinent items noted in HPI and remainder of comprehensive ROS otherwise negative.    Objective:     Vitals:   01/10/22 1103  BP: 124/71  Pulse: 63  Resp: 16  Weight: 136 lb (61.7 kg)  Height: 5\' 2"  (1.575 m)   Vitals:  WNL General appearance: alert, cooperative and no distress  HEENT: Normocephalic, without obvious abnormality, atraumatic Eyes: negative Throat: lips, mucosa, and tongue normal; teeth and gums normal  Respiratory: Clear to auscultation bilaterally  CV: Regular rate and rhythm  Breasts:  Normal appearance, no masses or tenderness, no nipple retraction or dimpling  GI: Soft, non-tender; bowel sounds normal; no masses,  no organomegaly  GU: External Genitalia:  Tanner V, no lesion Urethra:  No prolapse   Vagina: Pink, normal rugae, no blood or discharge  Cervix: No CMT, no lesion  Uterus:  Normal size and contour, non tender  Adnexa: Normal, no masses, non tender  Musculoskeletal: No edema, redness or tenderness in the  calves or thighs  Skin: No lesions or rash  Lymphatic: Axillary adenopathy: none     Psychiatric: Normal mood and behavior        Assessment:    Healthy female exam.    Plan:    Pap smear; next one will be in 3 years We will check TSH and other health maintenance labs.  Patient does have a primary care physician he will follow-up with abnormalities.  They will also help her with her body aches. Patient uses a menstrual calendar.  She will present this at her next yearly visit so we can monitor closely.  Patient understands that if she has very heavy bleeding, bleeding that lasts for 2 weeks, or very erratic periods of bleeding she should contact 01/12/22 through MyChart. We discussed colonoscopy and Cologuard.  She would like to think before requesting a test.  Primary care physician can also follow-up with this as well. Patient reports getting good calcium intake through her diet as she eats milk yogurt and cheese.  This will be more important as she gets to menopause. Yearly mammograms Patient did not report use of any birth control and we did not speak about this today.  I sent her a note through the patient portal to see if she would like to use birth control. Vitamin D deficiency.  Patient did not pick up  the vitamin D that was ordered last year.  We will check another level today before filling the prescription.

## 2022-01-10 NOTE — Progress Notes (Signed)
Pt never picked up the Vitamin D 50K Last mammogram 2/22

## 2022-01-11 LAB — CYTOLOGY - PAP
Comment: NEGATIVE
Diagnosis: NEGATIVE
High risk HPV: NEGATIVE

## 2022-01-13 ENCOUNTER — Encounter: Payer: Self-pay | Admitting: Obstetrics & Gynecology

## 2022-01-13 LAB — COMPREHENSIVE METABOLIC PANEL
AG Ratio: 1.6 (calc) (ref 1.0–2.5)
ALT: 20 U/L (ref 6–29)
AST: 17 U/L (ref 10–35)
Albumin: 3.9 g/dL (ref 3.6–5.1)
Alkaline phosphatase (APISO): 66 U/L (ref 31–125)
BUN: 15 mg/dL (ref 7–25)
CO2: 28 mmol/L (ref 20–32)
Calcium: 9.3 mg/dL (ref 8.6–10.2)
Chloride: 104 mmol/L (ref 98–110)
Creat: 0.81 mg/dL (ref 0.50–0.99)
Globulin: 2.5 g/dL (calc) (ref 1.9–3.7)
Glucose, Bld: 87 mg/dL (ref 65–99)
Potassium: 4.2 mmol/L (ref 3.5–5.3)
Sodium: 138 mmol/L (ref 135–146)
Total Bilirubin: 0.5 mg/dL (ref 0.2–1.2)
Total Protein: 6.4 g/dL (ref 6.1–8.1)

## 2022-01-13 LAB — CBC WITH DIFFERENTIAL/PLATELET
Absolute Monocytes: 554 cells/uL (ref 200–950)
Basophils Absolute: 40 cells/uL (ref 0–200)
Basophils Relative: 0.6 %
Eosinophils Absolute: 112 cells/uL (ref 15–500)
Eosinophils Relative: 1.7 %
HCT: 40.5 % (ref 35.0–45.0)
Hemoglobin: 13.2 g/dL (ref 11.7–15.5)
Lymphs Abs: 2706 cells/uL (ref 850–3900)
MCH: 29.5 pg (ref 27.0–33.0)
MCHC: 32.6 g/dL (ref 32.0–36.0)
MCV: 90.6 fL (ref 80.0–100.0)
MPV: 10.8 fL (ref 7.5–12.5)
Monocytes Relative: 8.4 %
Neutro Abs: 3188 cells/uL (ref 1500–7800)
Neutrophils Relative %: 48.3 %
Platelets: 353 10*3/uL (ref 140–400)
RBC: 4.47 10*6/uL (ref 3.80–5.10)
RDW: 12.7 % (ref 11.0–15.0)
Total Lymphocyte: 41 %
WBC: 6.6 10*3/uL (ref 3.8–10.8)

## 2022-01-13 LAB — LIPID PANEL
Cholesterol: 199 mg/dL (ref <200)
HDL: 59 mg/dL (ref >=50)
LDL Cholesterol (Calc): 117 mg/dL (calc) — ABNORMAL HIGH
Non-HDL Cholesterol (Calc): 140 mg/dL (calc) — ABNORMAL HIGH (ref <130)
Total CHOL/HDL Ratio: 3.4 (calc) (ref <5.0)
Triglycerides: 120 mg/dL (ref <150)

## 2022-01-13 LAB — VITAMIN D 25 HYDROXY (VIT D DEFICIENCY, FRACTURES): Vit D, 25-Hydroxy: 32 ng/mL (ref 30–100)

## 2022-01-13 LAB — HEMOGLOBIN A1C
Hgb A1c MFr Bld: 5.1 % of total Hgb (ref <5.7)
Mean Plasma Glucose: 100 mg/dL
eAG (mmol/L): 5.5 mmol/L

## 2022-01-13 LAB — TSH: TSH: 2.54 mIU/L

## 2022-01-18 ENCOUNTER — Encounter: Payer: Self-pay | Admitting: Family Medicine

## 2022-01-18 ENCOUNTER — Ambulatory Visit (INDEPENDENT_AMBULATORY_CARE_PROVIDER_SITE_OTHER): Payer: 59 | Admitting: Family Medicine

## 2022-01-18 ENCOUNTER — Other Ambulatory Visit: Payer: Self-pay

## 2022-01-18 VITALS — BP 107/68 | HR 61 | Temp 98.2°F | Ht 62.0 in | Wt 137.6 lb

## 2022-01-18 DIAGNOSIS — Z0000 Encounter for general adult medical examination without abnormal findings: Secondary | ICD-10-CM

## 2022-01-18 DIAGNOSIS — G4719 Other hypersomnia: Secondary | ICD-10-CM

## 2022-01-18 DIAGNOSIS — Z1211 Encounter for screening for malignant neoplasm of colon: Secondary | ICD-10-CM | POA: Diagnosis not present

## 2022-01-18 DIAGNOSIS — M549 Dorsalgia, unspecified: Secondary | ICD-10-CM

## 2022-01-18 DIAGNOSIS — R5383 Other fatigue: Secondary | ICD-10-CM | POA: Diagnosis not present

## 2022-01-18 DIAGNOSIS — Z789 Other specified health status: Secondary | ICD-10-CM

## 2022-01-18 DIAGNOSIS — R0683 Snoring: Secondary | ICD-10-CM

## 2022-01-18 LAB — VITAMIN B12: Vitamin B-12: 349 pg/mL (ref 211–911)

## 2022-01-18 MED ORDER — VITAMIN D (ERGOCALCIFEROL) 1.25 MG (50000 UNIT) PO CAPS: 50000.0000 [IU] | ORAL_CAPSULE | ORAL | 1 refills | Status: DC

## 2022-01-18 NOTE — Patient Instructions (Addendum)
Low Back Sprain or Strain Rehab Ask your health care provider which exercises are safe for you. Do exercises exactly as told by your health care provider and adjust them as directed. It is normal to feel mild stretching, pulling, tightness, or discomfort as you do these exercises. Stop right away if you feel sudden pain or your pain gets worse. Do not begin these exercises until told by your health care provider. Stretching and range-of-motion exercises These exercises warm up your muscles and joints and improve the movement and flexibility of your back. These exercises also help to relieve pain, numbness, and tingling. Lumbar rotation  Lie on your back on a firm bed or the floor with your knees bent. Straighten your arms out to your sides so each arm forms a 90-degree angle (right angle) with a side of your body. Slowly move (rotate) both of your knees to one side of your body until you feel a stretch in your lower back (lumbar). Try not to let your shoulders lift off the floor. Hold this position for __________ seconds. Tense your abdominal muscles and slowly move your knees back to the starting position. Repeat this exercise on the other side of your body. Repeat __________ times. Complete this exercise __________ times a day. Single knee to chest  Lie on your back on a firm bed or the floor with both legs straight. Bend one of your knees. Use your hands to move your knee up toward your chest until you feel a gentle stretch in your lower back and buttock. Hold your leg in this position by holding on to the front of your knee. Keep your other leg as straight as possible. Hold this position for __________ seconds. Slowly return to the starting position. Repeat with your other leg. Repeat __________ times. Complete this exercise __________ times a day. Prone extension on elbows  Lie on your abdomen on a firm bed or the floor (prone position). Prop yourself up on your elbows. Use your arms  to help lift your chest up until you feel a gentle stretch in your abdomen and your lower back. This will place some of your body weight on your elbows. If this is uncomfortable, try stacking pillows under your chest. Your hips should stay down, against the surface that you are lying on. Keep your hip and back muscles relaxed. Hold this position for __________ seconds. Slowly relax your upper body and return to the starting position. Repeat __________ times. Complete this exercise __________ times a day. Strengthening exercises These exercises build strength and endurance in your back. Endurance is the ability to use your muscles for a long time, even after they get tired. Pelvic tilt This exercise strengthens the muscles that lie deep in the abdomen. Lie on your back on a firm bed or the floor with your legs extended. Bend your knees so they are pointing toward the ceiling and your feet are flat on the floor. Tighten your lower abdominal muscles to press your lower back against the floor. This motion will tilt your pelvis so your tailbone points up toward the ceiling instead of pointing to your feet or the floor. To help with this exercise, you may place a small towel under your lower back and try to push your back into the towel. Hold this position for __________ seconds. Let your muscles relax completely before you repeat this exercise. Repeat __________ times. Complete this exercise __________ times a day. Alternating arm and leg raises  Get on your hands   and knees on a firm surface. If you are on a hard floor, you may want to use padding, such as an exercise mat, to cushion your knees. Line up your arms and legs. Your hands should be directly below your shoulders, and your knees should be directly below your hips. Lift your left leg behind you. At the same time, raise your right arm and straighten it in front of you. Do not lift your leg higher than your hip. Do not lift your arm higher  than your shoulder. Keep your abdominal and back muscles tight. Keep your hips facing the ground. Do not arch your back. Keep your balance carefully, and do not hold your breath. Hold this position for __________ seconds. Slowly return to the starting position. Repeat with your right leg and your left arm. Repeat __________ times. Complete this exercise __________ times a day. Abdominal set with straight leg raise  Lie on your back on a firm bed or the floor. Bend one of your knees and keep your other leg straight. Tense your abdominal muscles and lift your straight leg up, 4-6 inches (10-15 cm) off the ground. Keep your abdominal muscles tight and hold this position for __________ seconds. Do not hold your breath. Do not arch your back. Keep it flat against the ground. Keep your abdominal muscles tense as you slowly lower your leg back to the starting position. Repeat with your other leg. Repeat __________ times. Complete this exercise __________ times a day. Single leg lower with bent knees Lie on your back on a firm bed or the floor. Tense your abdominal muscles and lift your feet off the floor, one foot at a time, so your knees and hips are bent in 90-degree angles (right angles). Your knees should be over your hips and your lower legs should be parallel to the floor. Keeping your abdominal muscles tense and your knee bent, slowly lower one of your legs so your toe touches the ground. Lift your leg back up to return to the starting position. Do not hold your breath. Do not let your back arch. Keep your back flat against the ground. Repeat with your other leg. Repeat __________ times. Complete this exercise __________ times a day. Posture and body mechanics Good posture and healthy body mechanics can help to relieve stress in your body's tissues and joints. Body mechanics refers to the movements and positions of your body while you do your daily activities. Posture is part of body  mechanics. Good posture means: Your spine is in its natural S-curve position (neutral). Your shoulders are pulled back slightly. Your head is not tipped forward (neutral). Follow these guidelines to improve your posture and body mechanics in your everyday activities. Standing  When standing, keep your spine neutral and your feet about hip-width apart. Keep a slight bend in your knees. Your ears, shoulders, and hips should line up. When you do a task in which you stand in one place for a long time, place one foot up on a stable object that is 2-4 inches (5-10 cm) high, such as a footstool. This helps keep your spine neutral. Sitting  When sitting, keep your spine neutral and keep your feet flat on the floor. Use a footrest, if necessary, and keep your thighs parallel to the floor. Avoid rounding your shoulders, and avoid tilting your head forward. When working at a desk or a computer, keep your desk at a height where your hands are slightly lower than your elbows. Slide your   chair under your desk so you are close enough to maintain good posture. When working at a computer, place your monitor at a height where you are looking straight ahead and you do not have to tilt your head forward or downward to look at the screen. Resting When lying down and resting, avoid positions that are most painful for you. If you have pain with activities such as sitting, bending, stooping, or squatting, lie in a position in which your body does not bend very much. For example, avoid curling up on your side with your arms and knees near your chest (fetal position). If you have pain with activities such as standing for a long time or reaching with your arms, lie with your spine in a neutral position and bend your knees slightly. Try the following positions: Lying on your side with a pillow between your knees. Lying on your back with a pillow under your knees. Lifting  When lifting objects, keep your feet at least  shoulder-width apart and tighten your abdominal muscles. Bend your knees and hips and keep your spine neutral. It is important to lift using the strength of your legs, not your back. Do not lock your knees straight out. Always ask for help to lift heavy or awkward objects. This information is not intended to replace advice given to you by your health care provider. Make sure you discuss any questions you have with your health care provider. Document Revised: 02/01/2021 Document Reviewed: 02/01/2021 [employer] Patient Education  2022 [employer]  

## 2022-01-18 NOTE — Progress Notes (Signed)
Office Note 01/18/2022  CC:  Chief Complaint  Patient presents with   Annual Exam    Pt is fasting    HPI:  Patient is a 51 y.o. female who is here for annual health maintenance exam. She had her annual GYN MD visit on 01/10/2022.  Taking D3 150 U qd x 3 wks.  She never took the rx vit D rx'd in the past.  Describes fatigue, wakes up feeling only partially rested after about 6 hours of broken sleep.  No awakening with a gasp.  She does snore but husband has not commented on any abnormal sleep breathing.  Occasionally wakes up with a headache but not common.  Her fatigue worsens throughout the day and she does endorse some excessive daytime sleepiness.  She gets some back pain each day, worse towards the end of her day.  Sits all day at a computer.  Her menses are still occurring, occasional irregularity. No excessive menstrual bleeding.  Past Medical History:  Diagnosis Date   Anemia 10/30/2012   CTS (carpal tunnel syndrome)    PCO (polycystic ovaries)    conceived on metformin   Vestibular neuronitis 07/27/14   ED visit for vertigo: CT angio neck normal   Vitamin D deficiency 10/30/2012    Past Surgical History:  Procedure Laterality Date   CESAREAN SECTION  2009 &2002    Family History  Problem Relation Age of Onset   Diabetes Father    Hypertension Father    Hypertension Sister    Diabetes Sister    Hypertension Mother    Diabetes Mother    Hyperlipidemia Mother    Thyroid disease Mother    Diabetes Maternal Grandfather    Pancreatic cancer Brother        pancreatic    Social History   Socioeconomic History   Marital status: Married    Spouse name: Not on file   Number of children: Not on file   Years of education: Not on file   Highest education level: Not on file  Occupational History   Occupation: IT  Tobacco Use   Smoking status: Never   Smokeless tobacco: Never  Vaping Use   Vaping Use: Never used  Substance and Sexual Activity   Alcohol  use: No   Drug use: No   Sexual activity: Yes    Partners: Male    Birth control/protection: None  Other Topics Concern   Not on file  Social History Narrative   [place] country of origin        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         pets no        NO ets or tobacco.         Vegetarian      Smoking Status:  never      Caffeine use/day:  3-4      Does Patient Exercise:  no         Social Determinants of Health   Financial Resource Strain: Not on file  Food Insecurity: Not on file  Transportation Needs: Not on file  Physical Activity: Not on file  Stress: Not on file  Social Connections: Not on file  Intimate Partner Violence: Not on file    Outpatient Medications Prior to Visit  Medication Sig Dispense Refill   Multiple Vitamin (MULTIVITAMIN) tablet Take 1 tablet by mouth  daily.     acetaminophen (TYLENOL) 500 MG tablet Take 1,000 mg by mouth every 6 (six) hours as needed for mild pain. (Patient not taking: Reported on 12/07/2020)     cetirizine (ZYRTEC) 10 MG tablet Take 10 mg by mouth daily as needed for allergies. (Patient not taking: Reported on 12/07/2020)     Vitamin D, Ergocalciferol, (DRISDOL) 1.25 MG (50000 UNIT) CAPS capsule Take 1 capsule (50,000 Units total) by mouth every 7 (seven) days. (Patient not taking: Reported on 01/10/2022) 5 capsule 1   No facility-administered medications prior to visit.    No Known Allergies  ROS Review of Systems  Constitutional:  Positive for fatigue. Negative for appetite change, chills and fever.  HENT:  Negative for congestion, dental problem, ear pain and sore throat.   Eyes:  Negative for discharge, redness and visual disturbance.  Respiratory:  Negative for cough, chest tightness, shortness of breath and wheezing.   Cardiovascular:  Negative for chest pain, palpitations and leg swelling.  Gastrointestinal:  Negative for abdominal pain, blood in stool,  diarrhea, nausea and vomiting.  Genitourinary:  Negative for difficulty urinating, dysuria, flank pain, frequency, hematuria and urgency.  Musculoskeletal:  Positive for back pain. Negative for arthralgias, joint swelling, myalgias and neck stiffness.  Skin:  Negative for pallor and rash.  Neurological:  Negative for dizziness, speech difficulty, weakness and headaches.  Hematological:  Negative for adenopathy. Does not bruise/bleed easily.  Psychiatric/Behavioral:  Negative for confusion and sleep disturbance. The patient is not nervous/anxious.    PE; Vitals with BMI 01/18/2022 01/10/2022 12/07/2020  Height 5\' 2"  5\' 2"  5\' 2"   Weight 137 lbs 10 oz 136 lbs 131 lbs  BMI 25.16 24.87 23.95  Systolic 107 124 588  Diastolic 68 71 75  Pulse 61 63 61     Exam chaperoned by Harlene Salts, CMA Gen: Alert, well appearing.  Patient is oriented to person, place, time, and situation. AFFECT: pleasant, lucid thought and speech. ENT: Ears: EACs clear, normal epithelium.  TMs with good light reflex and landmarks bilaterally.  Eyes: no injection, icteris, swelling, or exudate.  EOMI, PERRLA. Nose: no drainage or turbinate edema/swelling.  No injection or focal lesion.  Mouth: lips without lesion/swelling.  Oral mucosa pink and moist.  Dentition intact and without obvious caries or gingival swelling.  Oropharynx without erythema, exudate, or swelling.  Neck: supple/nontender.  No LAD, mass, or TM.  Carotid pulses 2+ bilaterally, without bruits. CV: RRR, no m/r/g.   LUNGS: CTA bilat, nonlabored resps, good aeration in all lung fields. ABD: soft, NT, ND, BS normal.  No hepatospenomegaly or mass.  No bruits. EXT: no clubbing, cyanosis, or edema.  Musculoskeletal: no joint swelling, erythema, warmth, or tenderness.  ROM of all joints intact. Skin - no sores or suspicious lesions or rashes or color changes  Pertinent labs:  Lab Results  Component Value Date   TSH 2.54 01/12/2022   Lab Results  Component  Value Date   WBC 6.6 01/12/2022   HGB 13.2 01/12/2022   HCT 40.5 01/12/2022   MCV 90.6 01/12/2022   PLT 353 01/12/2022   No results found for: IRON, TIBC, FERRITIN Lab Results  Component Value Date   VITAMINB12 728 04/10/2009   Lab Results  Component Value Date   CREATININE 0.81 01/12/2022   BUN 15 01/12/2022   NA 138 01/12/2022   K 4.2 01/12/2022   CL 104 01/12/2022   CO2 28 01/12/2022   Lab Results  Component Value Date  ALT 20 01/12/2022   AST 17 01/12/2022   ALKPHOS 71 01/27/2020   BILITOT 0.5 01/12/2022   Lab Results  Component Value Date   CHOL 199 01/12/2022   Lab Results  Component Value Date   HDL 59 01/12/2022   Lab Results  Component Value Date   LDLCALC 117 (H) 01/12/2022   Lab Results  Component Value Date   TRIG 120 01/12/2022   Lab Results  Component Value Date   CHOLHDL 3.4 01/12/2022   Vitamin D level on 01/12/22 was 32.  Lab Results  Component Value Date   HGBA1C 5.1 01/12/2022   Lab Results  Component Value Date   [id number] 04/10/2009    ASSESSMENT AND PLAN:   1) Fatigue, excessive daytime sleepiness, snoring: Refer to pulm/sleep MD for consideration of w/u for OSA. She has some hair loss she is concerned about and her vitamin D level at her GYN was borderline low. She has a vegetarian diet.  We will check vitamin B12 and iron levels. Start 50 K units vitamin D tab once a week.  We will recheck her level in 2 months.  2) musculoskeletal back pain. She will try to maintain good posture and get ergonomic set up at work. Printed out some back stretches for her, encouraged her to use heat application as needed.  3)  Health maintenance exam: Reviewed age and gender appropriate health maintenance issues (prudent diet, regular exercise, health risks of tobacco and excessive alcohol, use of seatbelts, fire alarms in home, use of sunscreen).  Also reviewed age and gender appropriate health screening as well as vaccine  recommendations. Vaccines: Flu->declined.  Tdap is UTD. Labs: HP labs were done by her GYN provider 6 d/a-->all normal. Cervical ca screening: pap normal/neg HPV on 01/11/22 Breast ca screening: next mammogram due (last was feb 2022 via Dr. Gala Romney). Colon ca screening: average risk patient= as per latest guidelines it is recommended she start screening now-->she defers until next year.  An After Visit Summary was printed and given to the patient.  FOLLOW UP:  Return in about 2 months (around 03/18/2022) for f/u fatigue and vit D def.  Signed:  Crissie Sickles, MD           01/18/2022

## 2022-01-19 LAB — IRON,TIBC AND FERRITIN PANEL
%SAT: 39 % (calc) (ref 16–45)
Ferritin: 32 ng/mL (ref 16–232)
Iron: 125 ug/dL (ref 40–190)
TIBC: 320 mcg/dL (calc) (ref 250–450)

## 2022-03-04 ENCOUNTER — Institutional Professional Consult (permissible substitution): Payer: 59 | Admitting: Pulmonary Disease

## 2022-03-16 ENCOUNTER — Ambulatory Visit: Payer: 59 | Admitting: Family Medicine

## 2022-03-28 ENCOUNTER — Institutional Professional Consult (permissible substitution): Payer: 59 | Admitting: Pulmonary Disease

## 2022-05-09 ENCOUNTER — Ambulatory Visit: Payer: 59 | Admitting: Family Medicine

## 2022-05-09 ENCOUNTER — Encounter: Payer: Self-pay | Admitting: Family Medicine

## 2022-05-09 VITALS — BP 110/72 | HR 54 | Temp 98.4°F | Ht 62.0 in | Wt 133.6 lb

## 2022-05-09 DIAGNOSIS — E611 Iron deficiency: Secondary | ICD-10-CM | POA: Diagnosis not present

## 2022-05-09 DIAGNOSIS — E559 Vitamin D deficiency, unspecified: Secondary | ICD-10-CM | POA: Diagnosis not present

## 2022-05-09 DIAGNOSIS — R5383 Other fatigue: Secondary | ICD-10-CM

## 2022-05-09 DIAGNOSIS — E538 Deficiency of other specified B group vitamins: Secondary | ICD-10-CM

## 2022-05-09 LAB — VITAMIN B12: Vitamin B-12: 781 pg/mL (ref 211–911)

## 2022-05-09 LAB — VITAMIN D 25 HYDROXY (VIT D DEFICIENCY, FRACTURES): VITD: 45.91 ng/mL (ref 30.00–100.00)

## 2022-05-09 NOTE — Progress Notes (Signed)
OFFICE VISIT  05/09/2022  CC:  Chief Complaint  Patient presents with   Fatigue    Pt is fasting    Patient is a 51 y.o. female who presents for 49-month follow-up fatigue. A/P as of last visit: "1) Fatigue, excessive daytime sleepiness, snoring: Refer to pulm/sleep MD for consideration of w/u for OSA. She has some hair loss she is concerned about and her vitamin D level at her GYN was borderline low. She has a vegetarian diet.  We will check vitamin B12 and iron levels. Start 50 K units vitamin D tab once a week.  We will recheck her level in 2 months.   2) musculoskeletal back pain. She will try to maintain good posture and get ergonomic set up at work. Printed out some back stretches for her, encouraged her to use heat application as needed.   3)  Health maintenance exam: Reviewed age and gender appropriate health maintenance issues (prudent diet, regular exercise, health risks of tobacco and excessive alcohol, use of seatbelts, fire alarms in home, use of sunscreen).  Also reviewed age and gender appropriate health screening as well as vaccine recommendations. Vaccines: Flu->declined.  Tdap is UTD. Labs: HP labs were done by her GYN provider 6 d/a-->all normal. Cervical ca screening: pap normal/neg HPV on 01/11/22 Breast ca screening: next mammogram due (last was feb 2022 via Dr. Penne Lash). Colon ca screening: average risk patient= as per latest guidelines it is recommended she start screening now-->she defers until next year."  INTERIM HX: Doing fine, no acute complaints.  Vitamin D borderline low, also vitamin B-12 and iron levels were in the low end of normal range last visit so we started supplement of all 3 of these. She says her energy level overall has improved significantly.  She still has not gotten into a daily routine of walking.  Says she does not feel very motivated to do so but will push herself to do the same.  Past Medical History:  Diagnosis Date   Anemia  10/30/2012   CTS (carpal tunnel syndrome)    PCO (polycystic ovaries)    conceived on metformin   Vestibular neuronitis 07/27/14   ED visit for vertigo: CT angio neck normal   Vitamin D deficiency 10/30/2012    Past Surgical History:  Procedure Laterality Date   CESAREAN SECTION  2009 &2002    Outpatient Medications Prior to Visit  Medication Sig Dispense Refill   acetaminophen (TYLENOL) 500 MG tablet Take 1,000 mg by mouth every 6 (six) hours as needed for mild pain.     cetirizine (ZYRTEC) 10 MG tablet Take 10 mg by mouth daily as needed for allergies.     Multiple Vitamin (MULTIVITAMIN) tablet Take 1 tablet by mouth daily.     Vitamin D, Ergocalciferol, (DRISDOL) 1.25 MG (50000 UNIT) CAPS capsule Take 1 capsule (50,000 Units total) by mouth every 7 (seven) days. 12 capsule 1   No facility-administered medications prior to visit.    No Known Allergies  ROS As per HPI  PE:    05/09/2022    8:21 AM 01/18/2022    8:44 AM 01/10/2022   11:03 AM  Vitals with BMI  Height 5\' 2"  5\' 2"  5\' 2"   Weight 133 lbs 10 oz 137 lbs 10 oz 136 lbs  BMI 24.43 25.16 24.87  Systolic 110 107  Diastolic 72 68 71  Pulse 54 61 63   Physical Exam  General: Alert and well-appearing. Affect is pleasant, thought and speech are lucid.  No further exam today.  LABS:  Last CBC Lab Results  Component Value Date   WBC 6.6 01/12/2022   HGB 13.2 01/12/2022   HCT 40.5 01/12/2022   MCV 90.6 01/12/2022   MCH 29.5 01/12/2022   RDW 12.7 01/12/2022   PLT 353 01/12/2022   Lab Results  Component Value Date   IRON 125 01/18/2022   TIBC 320 01/18/2022   FERRITIN 32 01/18/2022   Last metabolic panel Lab Results  Component Value Date   GLUCOSE 87 01/12/2022   NA 138 01/12/2022   K 4.2 01/12/2022   CL 104 01/12/2022   CO2 28 01/12/2022   BUN 15 01/12/2022   CREATININE 0.81 01/12/2022   GFRNONAA >90 07/27/2014   CALCIUM 9.3 01/12/2022   PROT 6.4 01/12/2022   ALBUMIN 4.2 01/27/2020   BILITOT  0.5 01/12/2022   ALKPHOS 71 01/27/2020   AST 17 01/12/2022   ALT 20 01/12/2022   ANIONGAP 14 07/27/2014   Last lipids Lab Results  Component Value Date   CHOL 199 01/12/2022   HDL 59 01/12/2022   LDLCALC 117 (H) 01/12/2022   TRIG 120 01/12/2022   CHOLHDL 3.4 01/12/2022   Last hemoglobin A1c Lab Results  Component Value Date   HGBA1C 5.1 01/12/2022   Last thyroid functions Lab Results  Component Value Date   TSH 2.54 01/12/2022   Last vitamin D Lab Results  Component Value Date   VD25OH 32 01/12/2022   Last vitamin B12 and Folate Lab Results  Component Value Date   VITAMINB12 349 01/18/2022   FOLATE 16.5 04/10/2009   IMPRESSION AND PLAN:  #1 fatigue. Improved.  She had borderline levels of vitamin B-12, iron, and vitamin D. She has been taking supplement for these for the last several months. Check vitamin B12, iron, and vitamin D levels today.  Of note, she chose not to pursue specialist evaluation to eval for potential obstructive sleep apnea as the cause for her fatigue.  An After Visit Summary was printed and given to the patient.  FOLLOW UP: Return in about 8 months (around 01/09/2023) for annual CPE (fasting).  Signed:  Santiago Bumpers, MD           05/09/2022

## 2022-05-11 LAB — IRON,TIBC AND FERRITIN PANEL
%SAT: 55 % (calc) — ABNORMAL HIGH (ref 16–45)
Ferritin: 50 ng/mL (ref 16–232)
Iron: 170 ug/dL (ref 40–190)
TIBC: 309 mcg/dL (calc) (ref 250–450)

## 2022-06-15 IMAGING — MG MM DIGITAL SCREENING BILAT W/ TOMO AND CAD
8 series · 9 of 24 positions shown · non-contrast
Comparison: Previous exam(s).

CLINICAL DATA: Screening.

EXAM:
DIGITAL SCREENING BILATERAL MAMMOGRAM WITH TOMO AND CAD

[R CC synth-2D]
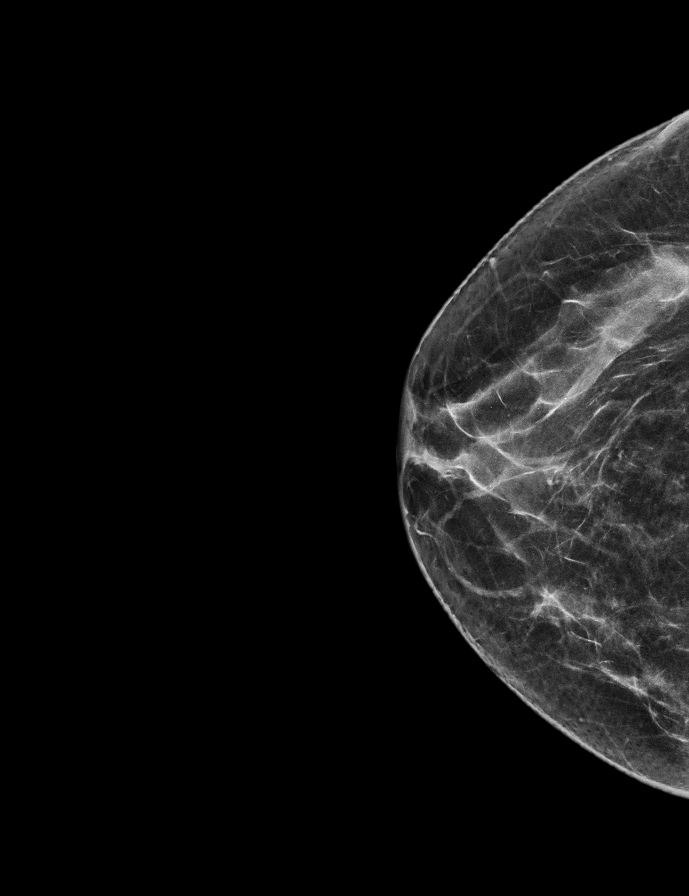

[L MLO synth-2D]
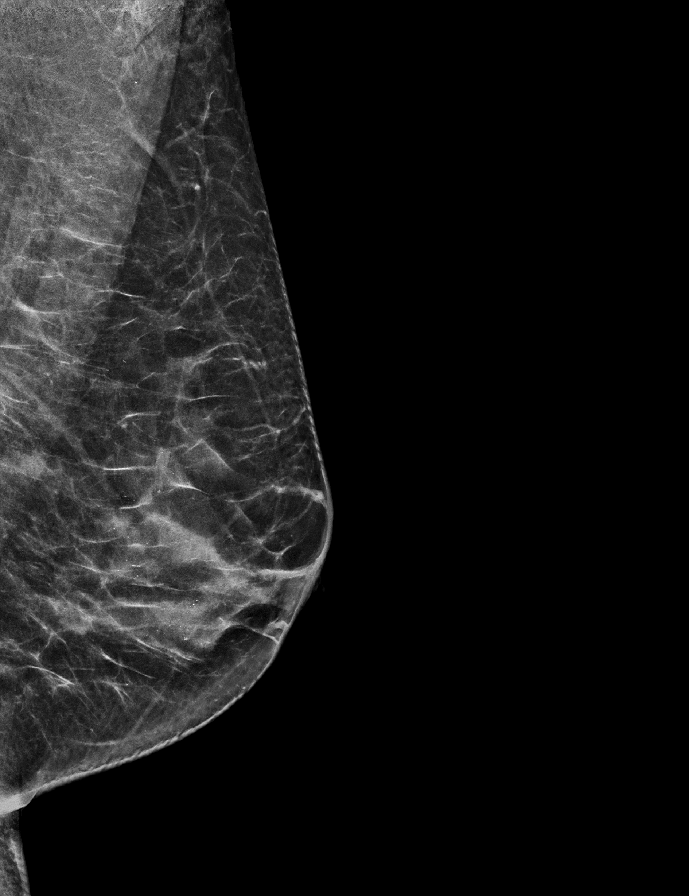

[R MLO synth-2D]
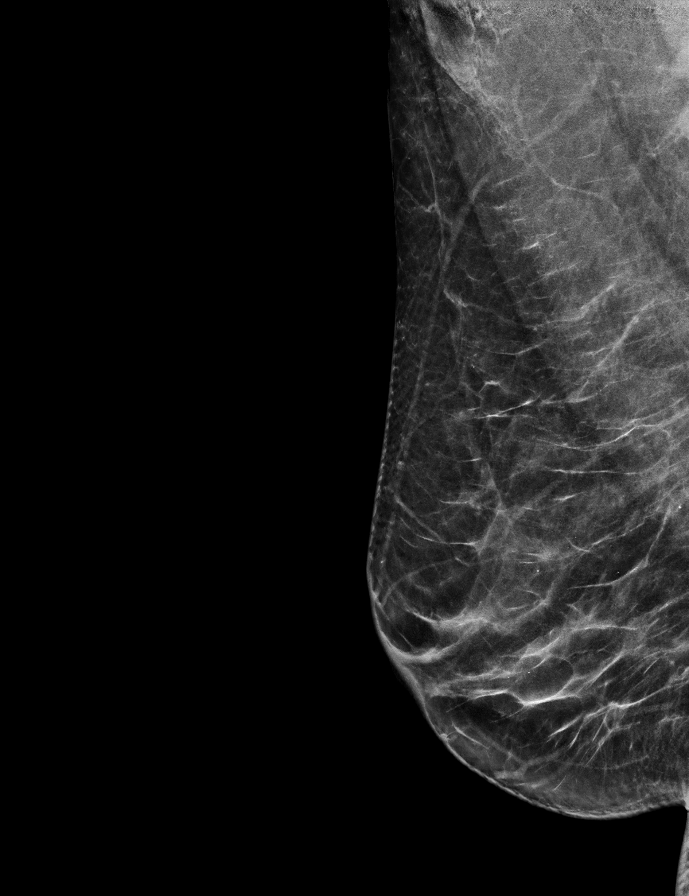

[L CC synth-2D]
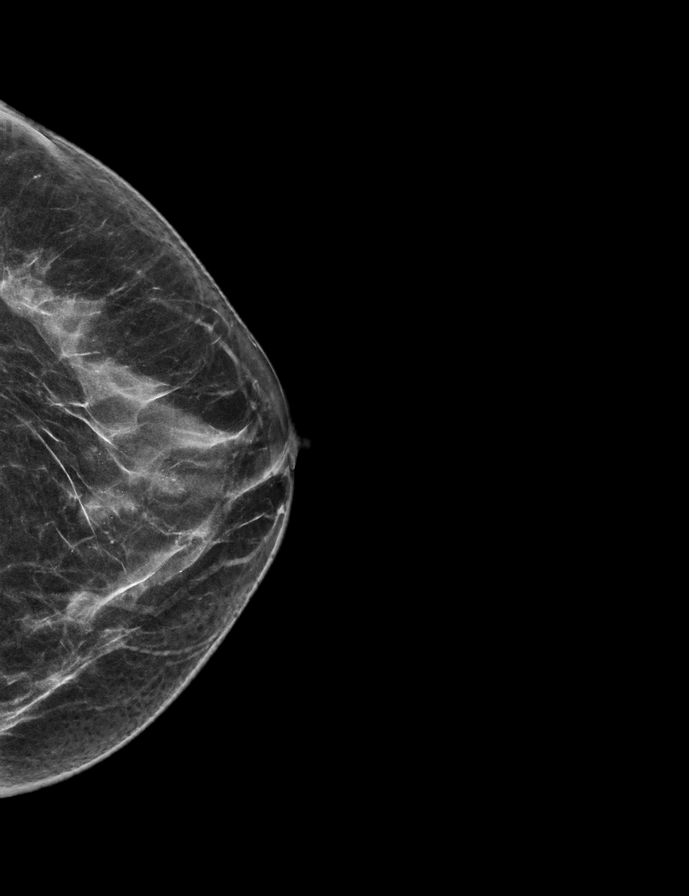

[R CC tomo · 2 of 56 frames shown]
[frame 19/56]
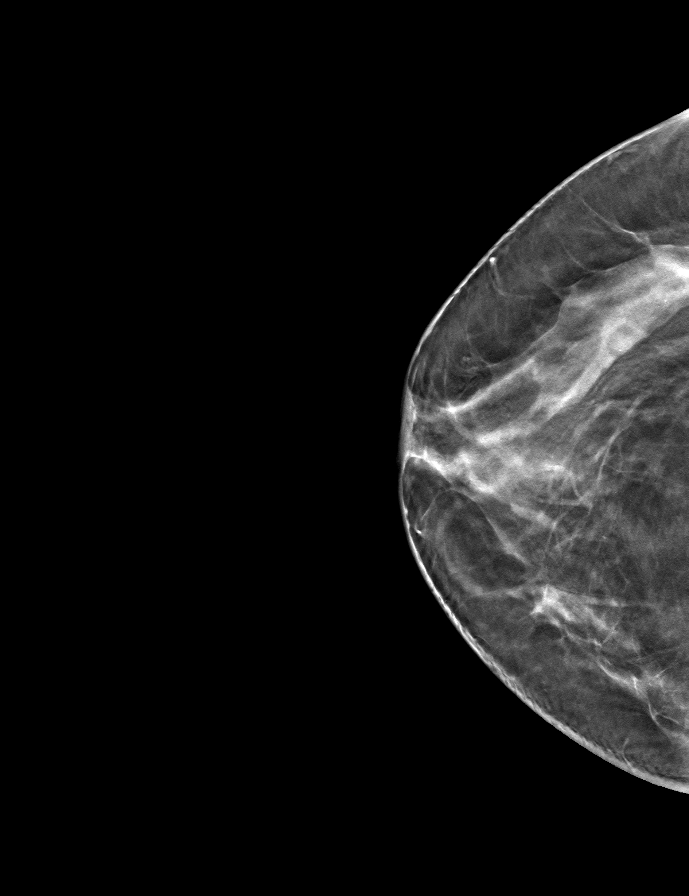
[frame 29/56]
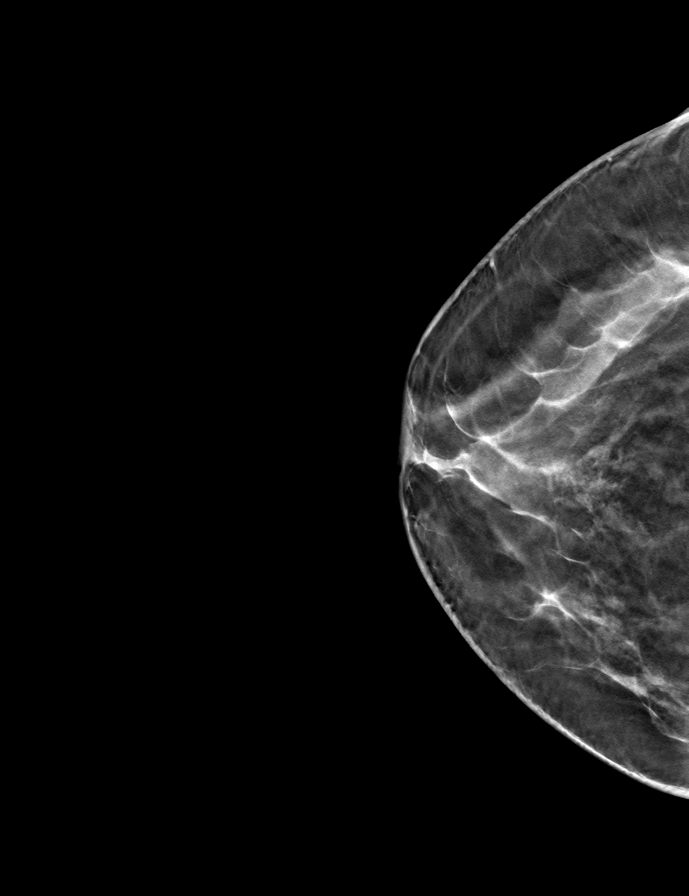

[L MLO tomo · tomo slice 28/55.0]
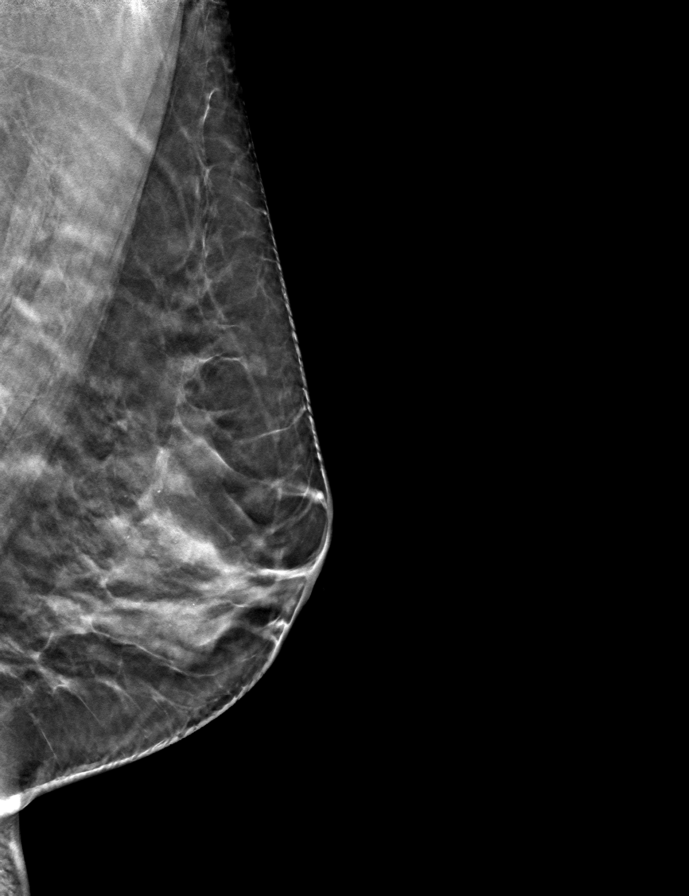

[L CC tomo · tomo slice 29/56.0]
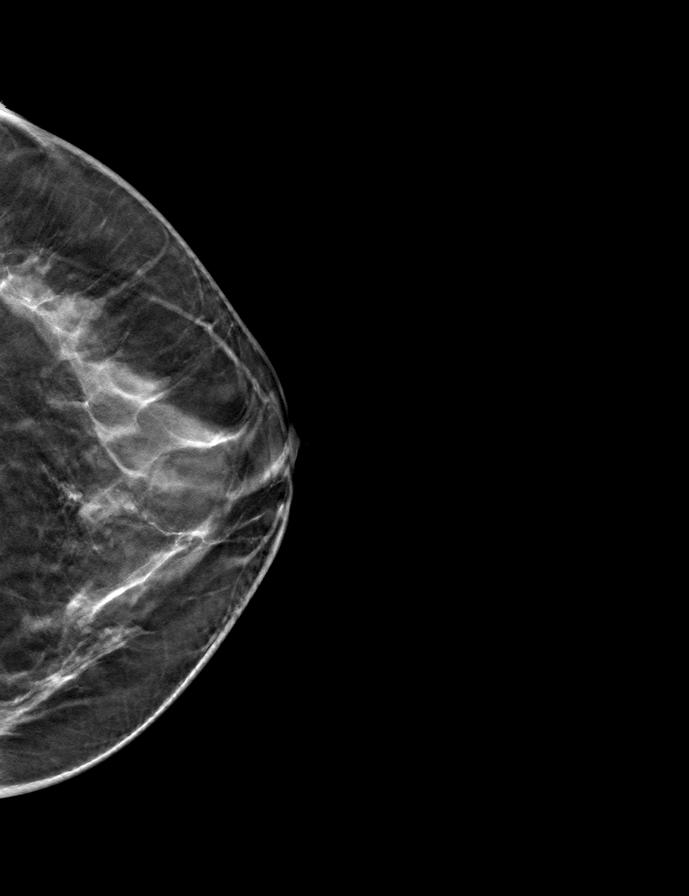

[R MLO tomo · tomo slice 31/61.0]
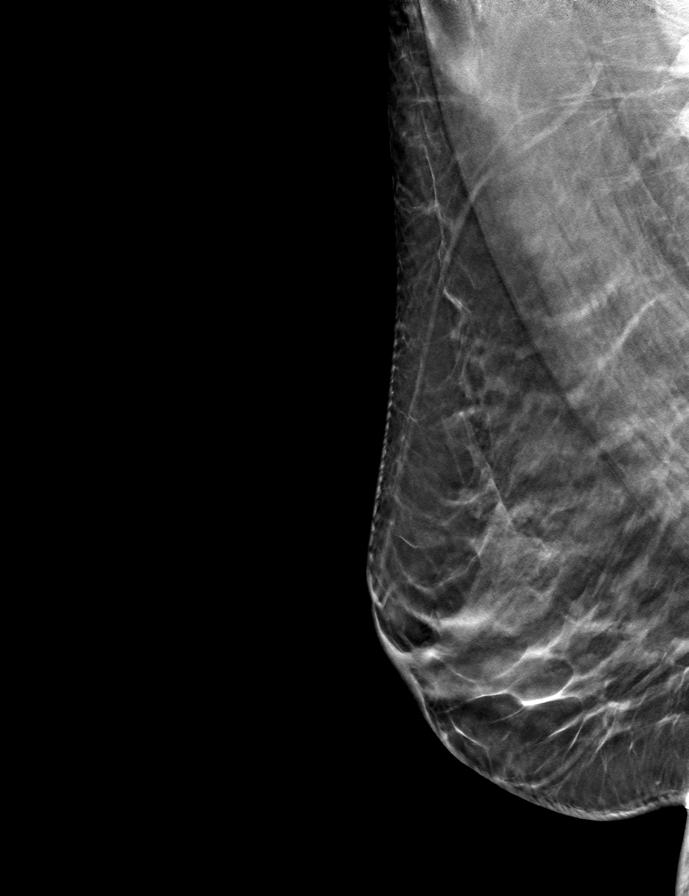

[9 of 24 positions shown; findings below may reference images not displayed]

ACR Breast Density Category c: The breast tissue is heterogeneously
dense, which may obscure small masses.
FINDINGS: There are no findings suspicious for malignancy. The images were
evaluated with computer-aided detection.
IMPRESSION: No mammographic evidence of malignancy. A result letter of this
screening mammogram will be mailed directly to the patient.

RECOMMENDATION:
Screening mammogram in one year. (Code:JF-[id number])

BI-RADS CATEGORY  1: Negative.

## 2022-07-18 ENCOUNTER — Telehealth: Payer: Self-pay | Admitting: Family Medicine

## 2022-07-18 DIAGNOSIS — G4719 Other hypersomnia: Secondary | ICD-10-CM

## 2022-07-18 DIAGNOSIS — G4733 Obstructive sleep apnea (adult) (pediatric): Secondary | ICD-10-CM

## 2022-07-18 NOTE — Telephone Encounter (Signed)
Patient was last seen 05/09/22 and declined to pursue specialist evaluation at that time as the cause for her fatigue. She would like to pursue this.   Please review and advise

## 2022-07-18 NOTE — Telephone Encounter (Signed)
Pt called to get information in regards to a referral for a sleep Dr. Royetta Car was discussed with Dr. Milinda Cave at her last visit. She states she misplaced the information.

## 2022-07-19 NOTE — Telephone Encounter (Signed)
New referral ordered to [facility] pulmonary

## 2022-07-20 NOTE — Telephone Encounter (Signed)
Patient advised about referral.

## 2022-08-11 ENCOUNTER — Other Ambulatory Visit: Payer: Self-pay | Admitting: Family Medicine

## 2022-08-14 NOTE — Progress Notes (Unsigned)
08/16/22- 51 yo F never smoker for sleep evaluation courtesy of Nicoletta Ba, MD with concern of OSA/ fatigue Medical problem list includes Allergic Rhinitis, PCOS, Hypercholesterolemia,  Epworth score-5 Body weight today-136 lbs Covid vax-no Flu vax- -----Pt consult she has began snoring in the last couple years, having daytime sleepiness. Denies morning headaches She is told that she has been snoring a lot in last couple of years. Denies breathing problems- nose or chest. No ENT surgery, heart or lung disease. Her personal concern is that she tends to wake during the night around 3AM, sometimes for bathroom or busy brain, then can't always get back to sleep. Some daytime tiredness around 2-3PM, rare naps. 1-2 cups of coffee in AM. May be perimenopausal. Married, one child, working as Oceanographer.  Prior to Admission medications   Medication Sig Start Date End Date Taking? Authorizing Provider  acetaminophen (TYLENOL) 500 MG tablet Take 1,000 mg by mouth every 6 (six) hours as needed for mild pain.   Yes [provider]  cetirizine (ZYRTEC) 10 MG tablet Take 10 mg by mouth daily as needed for allergies.   Yes [provider]  Multiple Vitamin (MULTIVITAMIN) tablet Take 1 tablet by mouth daily.   Yes [provider]  Vitamin D, Ergocalciferol, (DRISDOL) 1.25 MG (50000 UNIT) CAPS capsule TAKE 1 CAPSULE (50,000 UNITS TOTAL) BY MOUTH EVERY 7 (SEVEN) DAYS 08/12/22  Yes McGowen, Maryjean Morn, MD   Past Medical History:  Diagnosis Date   Anemia 10/30/2012   CTS (carpal tunnel syndrome)    PCO (polycystic ovaries)    conceived on metformin   Vestibular neuronitis 07/27/14   ED visit for vertigo: CT angio neck normal   Vitamin D deficiency 10/30/2012   Past Surgical History:  Procedure Laterality Date   CESAREAN SECTION  2009 &2002   Family History  Problem Relation Age of Onset   Diabetes Father    Hypertension Father    Hypertension Sister     Diabetes Sister    Hypertension Mother    Diabetes Mother    Hyperlipidemia Mother    Thyroid disease Mother    Diabetes Maternal Grandfather    Pancreatic cancer Brother        pancreatic   Social History   Socioeconomic History   Marital status: Married    Spouse name: Not on file   Number of children: Not on file   Years of education: Not on file   Highest education level: Not on file  Occupational History   Occupation: IT  Tobacco Use   Smoking status: Never   Smokeless tobacco: Never  Vaping Use   Vaping Use: Never used  Substance and Sexual Activity   Alcohol use: No   Drug use: No   Sexual activity: Yes    Partners: Male    Birth control/protection: None  Other Topics Concern   Not on file  Social History Narrative   [place] country of origin        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         pets no        NO ets or tobacco.         Vegetarian      Smoking Status:  never      Caffeine use/day:  3-4      Does Patient Exercise:  no  Social Determinants of Health   Financial Resource Strain: Not on file  Food Insecurity: Not on file  Transportation Needs: Not on file  Physical Activity: Not on file  Stress: Not on file  Social Connections: Not on file  Intimate Partner Violence: Not on file   ROS-see HPI   + = positive Constitutional:    weight loss, night sweats, fevers, chills, fatigue, lassitude. HEENT:    headaches, difficulty swallowing, tooth/dental problems, sore throat,       sneezing, itching, ear ache, nasal congestion, post nasal drip, snoring CV:    chest pain, orthopnea, PND, swelling in lower extremities, anasarca,                                   dizziness, palpitations Resp:   shortness of breath with exertion or at rest.                productive cough,   non-productive cough, coughing up of blood.              change in color of mucus.  wheezing.   Skin:    rash  or lesions. GI:  No-   heartburn, indigestion, abdominal pain, nausea, vomiting, diarrhea,                 change in bowel habits, loss of appetite GU: dysuria, change in color of urine, no urgency or frequency.   flank pain. MS:   joint pain, stiffness, decreased range of motion, back pain. Neuro-     nothing unusual Psych:  change in mood or affect.  depression or anxiety.   memory loss.  OBJ- Physical Exam General- Alert, Oriented, Affect-appropriate, Distress- none acute Skin- rash-none, lesions- none, excoriation- none Lymphadenopathy- none Head- atraumatic            Eyes- Gross vision intact, PERRLA, conjunctivae and secretions clear            Ears- Hearing, canals-normal            Nose- Clear, no-Septal dev, mucus, polyps, erosion, perforation             Throat- Mallampati IV , mucosa clear , drainage- none, tonsils- atrophic, + teeth Neck- flexible , trachea midline, no stridor , thyroid nl, carotid no bruit Chest - symmetrical excursion , unlabored           Heart/CV- RRR , no murmur , no gallop  , no rub, nl s1 s2                           - JVD- none , edema- none, stasis changes- none, varices- none           Lung- clear to P&A, wheeze- none, cough- none , dullness-none, rub- none           Chest wall-  Abd-  Br/ Gen/ Rectal- Not done, not indicated Extrem- cyanosis- none, clubbing, none, atrophy- none, strength- nl Neuro- grossly intact to observation

## 2022-08-16 ENCOUNTER — Encounter: Payer: Self-pay | Admitting: Internal Medicine

## 2022-08-16 ENCOUNTER — Ambulatory Visit (INDEPENDENT_AMBULATORY_CARE_PROVIDER_SITE_OTHER): Payer: 59 | Admitting: Internal Medicine

## 2022-08-16 VITALS — BP 116/72 | HR 56 | Ht 62.0 in | Wt 136.6 lb

## 2022-08-16 DIAGNOSIS — G4700 Insomnia, unspecified: Secondary | ICD-10-CM | POA: Insufficient documentation

## 2022-08-16 DIAGNOSIS — R0683 Snoring: Secondary | ICD-10-CM

## 2022-08-16 DIAGNOSIS — F5101 Primary insomnia: Secondary | ICD-10-CM

## 2022-08-16 NOTE — Assessment & Plan Note (Signed)
Not clear why this would have developed in last few years. Question is possible sleep apnea. Appropriate discussion. Plan- sleep study.

## 2022-08-16 NOTE — Assessment & Plan Note (Signed)
Waking after sleep onset. Not clear if this is related to sleep-disordered breathing or a separate problem. Plan- after sleep study, decide if trial of med ar another approach would be appropriate.

## 2022-08-16 NOTE — Patient Instructions (Signed)
Order- schedule home sleep test      dx snoring  Please callus for results and recommendations about 2 weeks after your sleep test  

## 2022-08-19 ENCOUNTER — Institutional Professional Consult (permissible substitution): Payer: 59 | Admitting: Internal Medicine

## 2022-10-18 ENCOUNTER — Telehealth: Payer: Self-pay

## 2022-10-18 NOTE — Telephone Encounter (Signed)
Please assist patient with scheduling, thanks.   [facility] - Client Nonclinical Telephone Record  [facility] Client [facility] - Client Client Site [facility] - Day Provider Santiago Bumpers - MD Contact Type Call Who Is Calling Patient / Member / Family / Caregiver Caller Name Dezarae Mcclaran Phone Number [phone] Patient Name Katie Beck Patient DOB 29-Jun-1971 Call Type Message Only Information Provided Reason for Call Request to Schedule Office Appointment Initial Comment Caller states she is having pain in her right knee and right leg. She is wanting to schedule an appointment. Disp. Time Disposition Final User 10/18/2022 12:30:54 PM General Information Provided Yes White, Jasmine Call Closed By: Gerre Scull Transaction Date/Time: 10/18/2022 12:25:21 PM (ET)

## 2022-10-19 ENCOUNTER — Encounter: Payer: Self-pay | Admitting: Family Medicine

## 2022-10-19 ENCOUNTER — Ambulatory Visit: Payer: 59 | Admitting: Family Medicine

## 2022-10-19 VITALS — BP 111/69 | HR 59 | Temp 99.0°F | Wt 135.6 lb

## 2022-10-19 DIAGNOSIS — M25561 Pain in right knee: Secondary | ICD-10-CM | POA: Diagnosis not present

## 2022-10-19 NOTE — Progress Notes (Signed)
OFFICE VISIT  10/19/2022  CC:  Chief Complaint  Patient presents with   Leg Pain    Right leg 20-25 days    Patient is a 51 y.o. female who presents for right leg pain.  HPI: Onset insidiously R knee pain about 3+ wks ago. Was doing a lot of zumba prior, rested it for a while now and applying ice and heat and no improvement. Worse with standing a while and with ambulation, sometimes has to limp. Has noted a bit of swelling in R knee. Points to medial aspect of R knee. The pain occ shoots down lower leg briefly.  Hears a crack/pop in knee with flexion sometimes. No trauma. No prior knee problems.    Past Medical History:  Diagnosis Date   Anemia 10/30/2012   CTS (carpal tunnel syndrome)    PCO (polycystic ovaries)    conceived on metformin   Vestibular neuronitis 07/27/14   ED visit for vertigo: CT angio neck normal   Vitamin D deficiency 10/30/2012    Past Surgical History:  Procedure Laterality Date   CESAREAN SECTION  2009 &2002    Outpatient Medications Prior to Visit  Medication Sig Dispense Refill   acetaminophen (TYLENOL) 500 MG tablet Take 1,000 mg by mouth every 6 (six) hours as needed for mild pain.     cetirizine (ZYRTEC) 10 MG tablet Take 10 mg by mouth daily as needed for allergies.     Multiple Vitamin (MULTIVITAMIN) tablet Take 1 tablet by mouth daily.     Vitamin D, Ergocalciferol, (DRISDOL) 1.25 MG (50000 UNIT) CAPS capsule TAKE 1 CAPSULE (50,000 UNITS TOTAL) BY MOUTH EVERY 7 (SEVEN) DAYS 4 capsule 0   No facility-administered medications prior to visit.    No Known Allergies  ROS As per HPI  PE:    10/19/2022    3:33 PM 08/16/2022    2:44 PM 05/09/2022    8:21 AM  Vitals with BMI  Height  5\' 2"  5\' 2"   Weight 135 lbs 10 oz 136 lbs 10 oz 133 lbs 10 oz  BMI  [id number]  Systolic [id number]  Diastolic 69 72 72  Pulse 59 56 54   Physical Exam  Gen: Alert, well appearing.  Patient is oriented to person, place, time, and  situation. AFFECT: pleasant, lucid thought and speech. R knee without swelling, erythema, or warmth. She has some tenderness over the medial aspect of the knee, most severe over the pes anserine region. Also tender with calf squeeze.  Pain increased with full flexion of the right knee although passive range of motion is intact. Lachman's with good endpoint bilaterally.  MCL and LCL stable, no pain with stressing. She has a bit of crackling heard in the knee with active and passive flexion and extension. No lower extremity swelling or edema.  LABS:  Last CBC Lab Results  Component Value Date   WBC 6.6 01/12/2022   HGB 13.2 01/12/2022   HCT 40.5 01/12/2022   MCV 90.6 01/12/2022   MCH 29.5 01/12/2022   RDW 12.7 01/12/2022   PLT 353 [id number]   Last metabolic panel Lab Results  Component Value Date   GLUCOSE 87 01/12/2022   NA 138 01/12/2022   K 4.2 01/12/2022   CL 104 01/12/2022   CO2 28 01/12/2022   BUN 15 01/12/2022   CREATININE 0.81 01/12/2022   GFRNONAA >90 07/27/2014   CALCIUM 9.3 01/12/2022   PROT 6.4 01/12/2022   ALBUMIN 4.2 01/27/2020   BILITOT 0.5  01/12/2022   ALKPHOS 71 01/27/2020   AST 17 01/12/2022   ALT 20 01/12/2022   ANIONGAP 14 07/27/2014   Last vitamin D Lab Results  Component Value Date   VD25OH 45.91 05/09/2022    IMPRESSION AND PLAN:  Right knee pain. Suspect overuse sprain/strain.  Seems most significant in MCL and pes anserine regions. I think a trial of physical therapy is the best next step. Corticosteroid injection could be considered in the future. We will check plain radiographs of right knee today. No medications recommended at this time.  She has already tried NSAIDs.  An After Visit Summary was printed and given to the patient.  FOLLOW UP: No follow-ups on file.  Signed:  Santiago Bumpers, MD           10/19/2022

## 2022-10-25 ENCOUNTER — Ambulatory Visit (HOSPITAL_BASED_OUTPATIENT_CLINIC_OR_DEPARTMENT_OTHER)
Admission: RE | Admit: 2022-10-25 | Discharge: 2022-10-25 | Disposition: A | Discharge status: Home / Self Care | Payer: 59 | Source: Ambulatory Visit | Attending: Family Medicine | Admitting: Family Medicine

## 2022-10-25 ENCOUNTER — Telehealth: Payer: Self-pay

## 2022-10-25 DIAGNOSIS — M25561 Pain in right knee: Secondary | ICD-10-CM | POA: Diagnosis present

## 2022-10-25 NOTE — Telephone Encounter (Signed)
Katie Beck from [facility] is on the line asking about additional orders. He is asking if an order for a tid fib(sp) should be order since she is also complaining of pain below the knee.  Please Advise

## 2022-10-25 NOTE — Telephone Encounter (Signed)
Just saw this message. Looks like she has already gotten her x-rays a couple hours ago. I think it is okay to go without the tib-fib for now.

## 2022-10-25 NOTE — Telephone Encounter (Signed)
Noted. Please see message below

## 2022-11-10 ENCOUNTER — Ambulatory Visit: Payer: 59

## 2022-11-10 DIAGNOSIS — R0683 Snoring: Secondary | ICD-10-CM

## 2022-11-11 DIAGNOSIS — R0683 Snoring: Secondary | ICD-10-CM | POA: Diagnosis not present

## 2022-11-29 ENCOUNTER — Telehealth: Payer: Self-pay

## 2022-11-29 DIAGNOSIS — M25561 Pain in right knee: Secondary | ICD-10-CM

## 2022-11-29 NOTE — Telephone Encounter (Signed)
Please advise 

## 2022-11-29 NOTE — Telephone Encounter (Signed)
Reason for Referral Request:  patient was seen in November by Dr. Anitra Lauth for right knee pain. Pt states pain is no better; requesting for referral  Has patient been seen PCP for this complaint? Yes  Patient scheduled on:  11/22  Yes, please find out following information.  Referral for which specialty:  Neuro (pt thinks it is nerve related  Preferred office/provider:  [facility]

## 2022-11-30 NOTE — Telephone Encounter (Signed)
Orthopedics referral is appropriate but not neurology. Pls put in order for [facility], dx right knee pain.

## 2022-12-16 NOTE — Progress Notes (Signed)
08/16/22- 53 yo F never smoker for sleep evaluation courtesy of Shawnie Dapper, MD with concern of OSA/ fatigue Medical problem list includes Allergic Rhinitis, PCOS, Hypercholesterolemia,  Epworth score-5 Body weight today-136 lbs Covid vax-no Flu vax- -----Pt consult she has began snoring in the last couple years, having daytime sleepiness. Denies morning headaches She is told that she has been snoring a lot in last couple of years. Denies breathing problems- nose or chest. No ENT surgery, heart or lung disease. Her personal concern is that she tends to wake during the night around 3AM, sometimes for bathroom or busy brain, then can't always get back to sleep. Some daytime tiredness around 2-3PM, rare naps. 1-2 cups of coffee in AM. May be perimenopausal. Married, one child, working as Pharmacist, community.  12/19/22- 60 yo F never smoker ffollowed for snoring and insomnia Medical problem list includes Allergic Rhinitis, PCOS, Hypercholesterolemia,  HST 11/10/22- AHI 1.1/ hr, desaturation to 86%, body weight 135 lbs Body weight today-136 lbs Covid vax-3 Flu vax- had We reviewed results of sleep study.  She does not have sleep apnea.  Discussed conservative management of snoring.  We also discussed management of insomnia with emphasis on good sleep habits.   ROS-see HPI   + = positive Constitutional:    weight loss, night sweats, fevers, chills, fatigue, lassitude. HEENT:    headaches, difficulty swallowing, tooth/dental problems, sore throat,       sneezing, itching, ear ache, nasal congestion, post nasal drip, snoring CV:    chest pain, orthopnea, PND, swelling in lower extremities, anasarca,                                   dizziness, palpitations Resp:   shortness of breath with exertion or at rest.                productive cough,   non-productive cough, coughing up of blood.              change in color of mucus.  wheezing.   Skin:    rash or lesions. GI:  No-   heartburn,  indigestion, abdominal pain, nausea, vomiting, diarrhea,                 change in bowel habits, loss of appetite GU: dysuria, change in color of urine, no urgency or frequency.   flank pain. MS:   joint pain, stiffness, decreased range of motion, back pain. Neuro-     nothing unusual Psych:  change in mood or affect.  depression or anxiety.   memory loss.  OBJ- Physical Exam General- Alert, Oriented, Affect-appropriate, Distress- none acute Skin- rash-none, lesions- none, excoriation- none Lymphadenopathy- none Head- atraumatic            Eyes- Gross vision intact, PERRLA, conjunctivae and secretions clear            Ears- Hearing, canals-normal            Nose- Clear, no-Septal dev, mucus, polyps, erosion, perforation             Throat- Mallampati IV , mucosa clear , drainage- none, tonsils- atrophic, + teeth Neck- flexible , trachea midline, no stridor , thyroid nl, carotid no bruit Chest - symmetrical excursion , unlabored           Heart/CV- RRR , no murmur , no gallop  , no rub, nl s1 s2                           -  JVD- none , edema- none, stasis changes- none, varices- none           Lung- clear to P&A, wheeze- none, cough- none , dullness-none, rub- none           Chest wall-  Abd-  Br/ Gen/ Rectal- Not done, not indicated Extrem- cyanosis- none, clubbing, none, atrophy- none, strength- nl Neuro- grossly intact to observation

## 2022-12-19 ENCOUNTER — Encounter: Payer: Self-pay | Admitting: Internal Medicine

## 2022-12-19 ENCOUNTER — Ambulatory Visit: Payer: Self-pay

## 2022-12-19 ENCOUNTER — Ambulatory Visit: Payer: 59 | Admitting: Internal Medicine

## 2022-12-19 ENCOUNTER — Ambulatory Visit: Payer: 59

## 2022-12-19 ENCOUNTER — Ambulatory Visit: Payer: 59 | Admitting: Family Medicine

## 2022-12-19 ENCOUNTER — Ambulatory Visit (INDEPENDENT_AMBULATORY_CARE_PROVIDER_SITE_OTHER): Payer: 59

## 2022-12-19 VITALS — BP 112/70 | HR 61 | Ht 62.5 in | Wt 135.0 lb

## 2022-12-19 VITALS — BP 130/78 | HR 63 | Ht 62.5 in | Wt 134.0 lb

## 2022-12-19 DIAGNOSIS — M5431 Sciatica, right side: Secondary | ICD-10-CM

## 2022-12-19 DIAGNOSIS — M25561 Pain in right knee: Secondary | ICD-10-CM | POA: Diagnosis not present

## 2022-12-19 DIAGNOSIS — M79604 Pain in right leg: Secondary | ICD-10-CM

## 2022-12-19 DIAGNOSIS — G8929 Other chronic pain: Secondary | ICD-10-CM

## 2022-12-19 DIAGNOSIS — R0683 Snoring: Secondary | ICD-10-CM | POA: Diagnosis not present

## 2022-12-19 DIAGNOSIS — F5101 Primary insomnia: Secondary | ICD-10-CM | POA: Diagnosis not present

## 2022-12-19 DIAGNOSIS — M7989 Other specified soft tissue disorders: Secondary | ICD-10-CM

## 2022-12-19 MED ORDER — LORAZEPAM 0.5 MG PO TABS: ORAL_TABLET | ORAL | 0 refills | Status: DC

## 2022-12-19 MED ORDER — TEMAZEPAM 15 MG PO CAPS: 15.0000 mg | ORAL_CAPSULE | Freq: Every evening | ORAL | 2 refills | Status: DC | PRN

## 2022-12-19 NOTE — Patient Instructions (Addendum)
We are going to try to help your insomnia with temazepam- 1 or 2 caps at bedtime if needed  For snoring- try to sleep off your back. You can try otc mouthpieces or chin straps for snoring from the drug store. Other on-line gadgets help some people with snoring.

## 2022-12-19 NOTE — Progress Notes (Signed)
I, Peterson Lombard, LAT, ATC acting as a scribe for Lynne Leader, MD.  Subjective:    CC: Right knee pain  HPI: Patient is a 52 year old female presenting with R knee pain ongoing since Oct. Pt started doing Zumba and increased her mileage walking around the time when her R knee started hurting. Patient locates pain to the medial aspect of her R knee and along the medial portion of her R lower leg.  Low back pain: yes R Knee swelling: yes Mechanical symptoms: yes Radiates: yes Aggravates: walking, rotational motions Treatments tried: rest, change shoes, IBU  Dx imaging: 10/25/22 R knee XR  Pertinent review of Systems: No fevers or chills  Relevant historical information: Vitamin D deficiency   Objective:    Vitals:   12/19/22 1453  BP: 130/78  Pulse: 63  SpO2: 97%   General: Well Developed, well nourished, and in no acute distress.   MSK: Right knee: Normal-appearing Normal motion. Tender palpation medial joint line and posterior medial knee. Stable ligamentous exam. Positive McMurray's test. Intact strength pain with resisted knee extension and flexion felt at the medial aspect of the knee.  Right lower leg: Right calf slight swelling compared to left.  No erythema or palpable cords. Mildly tender palpation posterior calf.  Normal foot and ankle motion and strength.  L-spine: Normal appearing Nontender to palpation midline. Normal lumbar motion. Lower extremity strength. Mildly positive slump test  Lab and Radiology Results   X-ray images L-spine and tib-fib right obtained today personally and independently interpreted  L-spine: Mild DDD L5-S1.  No acute fractures are visible.  Right tib-fib: No acute fractures.  No significant abnormalities along the shaft of the tibia or fibula.  Await formal radiology review  EXAM: RIGHT KNEE - COMPLETE 4+ VIEW   COMPARISON:  None Available.   FINDINGS: Normal alignment. The joint spaces are preserved. There  is trace patellofemoral and lateral tibiofemoral spurring. Possible subchondral cyst in the central patella. No fracture. No erosion, focal bone abnormality or bone destruction. No significant knee joint effusion. Unremarkable soft tissues.   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     Electronically Signed   By: Keith Rake M.D.   On: 10/26/2022 11:44   I, Lynne Leader, personally (independently) visualized and performed the interpretation of the images attached in this note.   Impression and Recommendations:    Assessment and Plan: 52 y.o. female with right knee and leg pain. I think Aldea likely has 2 discrete issues.  The knee pain is likely medial meniscus tear.  She has mechanical symptoms and a positive McMurray's test.  This is been ongoing since prior to her visit with her primary care provider November 28.  Plan for MRI knee to evaluate for meniscus tear and for next step plan including injection or even surgery.  Additionally she has pain radiating into the posterior calf and is S1 radiculopathy vascular ultrasound to rule this out as well.  Again this has been ongoing for greater than 6 weeks.  Plan for MRI lumbar spine to evaluate for S1 radiculopathy right leg.  She does mention some MRI claustrophobia.  Will prescribe Ativan.  Recheck following MRI.  PDMP reviewed during this encounter. Orders Placed This Encounter  Procedures   DG Lumbar Spine 2-3 Views    Standing Status:   Future    Number of Occurrences:   1    Standing Expiration Date:   12/20/2023    Order Specific Question:  Reason for Exam (SYMPTOM  OR DIAGNOSIS REQUIRED)    Answer:   eval sciatica rt    Order Specific Question:   Is patient pregnant?    Answer:   No    Order Specific Question:   Preferred imaging location?    Answer:   [facility]   DG Tibia/Fibula Right    Standing Status:   Future    Number of Occurrences:   1     Standing Expiration Date:   12/20/2023    Order Specific Question:   Reason for Exam (SYMPTOM  OR DIAGNOSIS REQUIRED)    Answer:   eval knee rt leg pain    Order Specific Question:   Is patient pregnant?    Answer:   No    Order Specific Question:   Preferred imaging location?    Answer:   [facility]   MR Lumbar Spine Wo Contrast    Standing Status:   Future    Standing Expiration Date:   12/20/2023    Order Specific Question:   What is the patient's sedation requirement?    Answer:   Anti-anxiety    Order Specific Question:   Does the patient have a pacemaker or implanted devices?    Answer:   No    Order Specific Question:   Preferred imaging location?    Answer:   [facility] (table limit-350lbs)   MR Knee Right Wo Contrast    Standing Status:   Future    Standing Expiration Date:   12/20/2023    Order Specific Question:   What is the patient's sedation requirement?    Answer:   No Sedation    Order Specific Question:   Does the patient have a pacemaker or implanted devices?    Answer:   No    Order Specific Question:   Preferred imaging location?    Answer:   [facility] development [facility] (table limit-350lbs)   Meds ordered this encounter  Medications   LORazepam (ATIVAN) 0.5 MG tablet    Sig: 1-2 tabs 30 - 60 min prior to MRI. Do not drive with this medicine.    Dispense:  4 tablet    Refill:  0    Discussed warning signs or symptoms. Please see discharge instructions. Patient expresses understanding.   The above documentation has been reviewed and is accurate and complete Lynne Leader, M.D.

## 2022-12-19 NOTE — Patient Instructions (Addendum)
Thank you for coming in today.   Anticipate  Vascular Ultrasound from  [facility] at [address] [place], [place] [address] Phone: [phone]   Please get an Xray today before you leave   You should hear from MRI scheduling within 1 week. If you do not hear please let me know.    Recheck following the MRIs.

## 2022-12-21 ENCOUNTER — Ambulatory Visit: Payer: 59 | Admitting: Family Medicine

## 2022-12-21 ENCOUNTER — Ambulatory Visit (HOSPITAL_COMMUNITY)
Admission: RE | Admit: 2022-12-21 | Discharge: 2022-12-21 | Disposition: A | Discharge status: Home / Self Care | Payer: 59 | Source: Ambulatory Visit | Attending: Family Medicine | Admitting: Family Medicine

## 2022-12-21 ENCOUNTER — Telehealth: Payer: Self-pay | Admitting: Family Medicine

## 2022-12-21 DIAGNOSIS — M79604 Pain in right leg: Secondary | ICD-10-CM | POA: Diagnosis not present

## 2022-12-21 DIAGNOSIS — M7989 Other specified soft tissue disorders: Secondary | ICD-10-CM

## 2022-12-21 NOTE — Telephone Encounter (Signed)
Patient called to follow up on xrays that were done. She asked if we could call her when Dr Georgina Snell has reviewed them.

## 2022-12-22 NOTE — Progress Notes (Signed)
Right lower leg x-ray looks normal to radiology.

## 2022-12-22 NOTE — Progress Notes (Signed)
Lumbar spine x-ray looks normal to radiology.

## 2022-12-22 NOTE — Telephone Encounter (Signed)
Called and spoke to pt at great length about her XR and vasc US results. Pt seemed very anxious and had lots of questions. Pt was advised to proceed to the MRI, which she said was scheduled for Saturday, and then we would see what this imaging found and go from there. Pt also advised to be patient over the weekend, as it would likely be Mon or Tues until the MRI's were read by radiology. Pt verbalized understanding.

## 2022-12-22 NOTE — Progress Notes (Signed)
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  This is where fluid accumulates in the back of the knee and looks like it extends down into the calf.  Will see this more accurately on the MRI.

## 2022-12-23 NOTE — Telephone Encounter (Signed)
Patient called stating that she wasn't sure if she should continue with the back MRI at this time being that there is more going on with her knee.  I told her that at this point it would be up to her.

## 2022-12-24 ENCOUNTER — Other Ambulatory Visit: Payer: 59

## 2022-12-24 ENCOUNTER — Ambulatory Visit (INDEPENDENT_AMBULATORY_CARE_PROVIDER_SITE_OTHER): Payer: 59

## 2022-12-24 DIAGNOSIS — G8929 Other chronic pain: Secondary | ICD-10-CM

## 2022-12-24 DIAGNOSIS — M5431 Sciatica, right side: Secondary | ICD-10-CM

## 2022-12-24 DIAGNOSIS — M25561 Pain in right knee: Secondary | ICD-10-CM

## 2022-12-27 ENCOUNTER — Telehealth: Payer: Self-pay | Admitting: Family Medicine

## 2022-12-27 ENCOUNTER — Encounter: Payer: Self-pay | Admitting: Family Medicine

## 2022-12-27 DIAGNOSIS — S83241A Other tear of medial meniscus, current injury, right knee, initial encounter: Secondary | ICD-10-CM

## 2022-12-27 DIAGNOSIS — G8929 Other chronic pain: Secondary | ICD-10-CM

## 2022-12-27 NOTE — Progress Notes (Signed)
MRI of the knee shows 2 main issues.  You have a meniscus tear at the medial meniscus at the back of the knee that is potentially repairable.  This is probably causing a fair amount of pain and discomfort.  Additionally you have a fair amount of arthritis especially underneath the kneecap that is causing some pain and discomfort.  I think it is probably worth your time to have a conversation with the orthopedic surgeon about your knee options.  If the surgeon thinks that surgery is not the best option then an injection would be reasonable.  I am going to refer you to Dr. Sammuel Hines at [facility]

## 2022-12-27 NOTE — Telephone Encounter (Signed)
Orthopedic surgery referral placed 

## 2023-01-04 ENCOUNTER — Ambulatory Visit (INDEPENDENT_AMBULATORY_CARE_PROVIDER_SITE_OTHER): Payer: 59 | Admitting: Orthopaedic Surgery

## 2023-01-04 DIAGNOSIS — S83241A Other tear of medial meniscus, current injury, right knee, initial encounter: Secondary | ICD-10-CM | POA: Diagnosis not present

## 2023-01-04 NOTE — Progress Notes (Signed)
Chief Complaint: Right knee pain     History of Present Illness:    Katie Beck is a 52 y.o. female presents today with ongoing right knee pain which has been worse since the November December timeframe when she began ramping up her Zumba.  She does enjoy being active.  Unfortunately this has been quite limited even in terms of basic walking as result of this injury.  She is here today as a referral from Dr. Georgina Snell as she does have a meniscal root tear.  She has not had any injections.  She has been working with physical therapy with limited relief.  She is here today with her husband.    Surgical History:   None  PMH/PSH/Family History/Social History/Meds/Allergies:    Past Medical History:  Diagnosis Date   Anemia 10/30/2012   CTS (carpal tunnel syndrome)    PCO (polycystic ovaries)    conceived on metformin   Vestibular neuronitis 07/27/14   ED visit for vertigo: CT angio neck normal   Vitamin D deficiency 10/30/2012   Past Surgical History:  Procedure Laterality Date   CESAREAN SECTION  2009 &2002   Social History   Socioeconomic History   Marital status: Married    Spouse name: Not on file   Number of children: Not on file   Years of education: Not on file   Highest education level: Not on file  Occupational History   Occupation: IT  Tobacco Use   Smoking status: Never   Smokeless tobacco: Never  Vaping Use   Vaping Use: Never used  Substance and Sexual Activity   Alcohol use: No   Drug use: No   Sexual activity: Yes    Partners: Male    Birth control/protection: None  Other Topics Concern   Not on file  Social History Narrative   [place] country of origin        works outside [employer] education      [name] of 4 (50 y/o, 50 y/o + married).         pets no        NO ets or tobacco.         Vegetarian      Smoking Status:  never      Caffeine use/day:  3-4      Does Patient Exercise:  no          Social Determinants of Radio broadcast assistant Strain: Not on file  Food Insecurity: Not on file  Transportation Needs: Not on file  Physical Activity: Not on file  Stress: Not on file  Social Connections: Not on file   Family History  Problem Relation Age of Onset   Diabetes Father    Hypertension Father    Hypertension Sister    Diabetes Sister    Hypertension Mother    Diabetes Mother    Hyperlipidemia Mother    Thyroid disease Mother    Diabetes Maternal Grandfather    Pancreatic cancer Brother        pancreatic   No Known Allergies Current Outpatient Medications  Medication Sig Dispense Refill   acetaminophen (TYLENOL) 500 MG tablet Take 1,000 mg by mouth every 6 (six) hours as needed for mild pain.     cetirizine (ZYRTEC)  10 MG tablet Take 10 mg by mouth daily as needed for allergies.     LORazepam (ATIVAN) 0.5 MG tablet 1-2 tabs 30 - 60 min prior to MRI. Do not drive with this medicine. 4 tablet 0   Multiple Vitamin (MULTIVITAMIN) tablet Take 1 tablet by mouth daily.     temazepam (RESTORIL) 15 MG capsule Take 1 capsule (15 mg total) by mouth at bedtime as needed for sleep. 30 capsule 2   Vitamin D, Ergocalciferol, (DRISDOL) 1.25 MG (50000 UNIT) CAPS capsule TAKE 1 CAPSULE (50,000 UNITS TOTAL) BY MOUTH EVERY 7 (SEVEN) DAYS 4 capsule 0   No current facility-administered medications for this visit.   No results found.  Review of Systems:   A ROS was performed including pertinent positives and negatives as documented in the HPI.  Physical Exam :   Constitutional: NAD and appears stated age Neurological: Alert and oriented Psych: Appropriate affect and cooperative Last menstrual period 11/18/2022.   Comprehensive Musculoskeletal Exam:      Musculoskeletal Exam  Gait Normal  Alignment Normal   Right Left  Inspection Normal Normal  Palpation    Tenderness Medial joint line none  Crepitus None None  Effusion None None  Range of Motion    Extension 0  0  Flexion 135 135  Strength    Extension 5/5 5/5  Flexion 5/5 5/5  Ligament Exam     Generalized Laxity No No  Lachman Negative Negative   Pivot Shift Negative Negative  Anterior Drawer Negative Negative  Valgus at 0 Negative Negative  Valgus at 20 Negative Negative  Varus at 0 0 0  Varus at 20   0 0  Posterior Drawer at 90 0 0  Vascular/Lymphatic Exam    Edema None None  Venous Stasis Changes No No  Distal Circulation Normal Normal  Neurologic    Light Touch Sensation Intact Intact  Special Tests: Positive McMurray medially     Imaging:   Xray (4 views right knee): Normal  MRI (right knee MRI): There is a full-thickness posterior meniscal root tear  I personally reviewed and interpreted the radiographs.   Assessment:   52 y.o. female with a right knee medial meniscal root tear after she has become more active and exercising.  I did describe the natural progression of root tears.  I did describe that in longitudinal studies how there has been shown to be an increased risk and progression to arthritis with unaddressed meniscal root tears.  We did discuss the implications of this.  I did discuss that ultimately I would recommend surgical treatment for root fixation in order to improve her long-term outcome.  I did understand the implications of both operative and nonoperative treatment.  She would like to further discuss options with her husband and she will let us know should she further want to consider right knee medial meniscal root repair  Plan :    -She will send [place] a MyChart message should she want to consider medial meniscal root repair     I personally saw and evaluated the patient, and participated in the management and treatment plan.  Vanetta Mulders, MD Attending Physician, Orthopedic Surgery  This document was dictated using Dragon voice recognition software. A reasonable attempt at proof reading has been made to minimize errors.

## 2023-01-09 ENCOUNTER — Ambulatory Visit: Payer: 59 | Admitting: Family Medicine

## 2023-01-09 ENCOUNTER — Encounter: Payer: Self-pay | Admitting: Family Medicine

## 2023-01-13 NOTE — Progress Notes (Unsigned)
Last Mammogram: 12/30/20- normal Last Pap Smear:  01/10/22- negative Last Colon Screening;  never done Seat Belts:   yes Sun Screen:   yes Dental Check Up:  yes Brush & Floss:  yes

## 2023-01-16 ENCOUNTER — Encounter: Payer: Self-pay | Admitting: Obstetrics & Gynecology

## 2023-01-16 ENCOUNTER — Ambulatory Visit (INDEPENDENT_AMBULATORY_CARE_PROVIDER_SITE_OTHER): Payer: 59 | Admitting: Obstetrics & Gynecology

## 2023-01-16 ENCOUNTER — Encounter (HOSPITAL_BASED_OUTPATIENT_CLINIC_OR_DEPARTMENT_OTHER): Payer: Self-pay | Admitting: Orthopaedic Surgery

## 2023-01-16 VITALS — BP 117/65 | HR 69 | Ht 62.5 in | Wt 139.0 lb

## 2023-01-16 DIAGNOSIS — N911 Secondary amenorrhea: Secondary | ICD-10-CM

## 2023-01-16 DIAGNOSIS — Z1211 Encounter for screening for malignant neoplasm of colon: Secondary | ICD-10-CM

## 2023-01-16 DIAGNOSIS — Z01419 Encounter for gynecological examination (general) (routine) without abnormal findings: Secondary | ICD-10-CM | POA: Diagnosis not present

## 2023-01-16 DIAGNOSIS — F32A Depression, unspecified: Secondary | ICD-10-CM | POA: Diagnosis not present

## 2023-01-16 DIAGNOSIS — E559 Vitamin D deficiency, unspecified: Secondary | ICD-10-CM

## 2023-01-16 LAB — POCT URINE PREGNANCY: Preg Test, Ur: NEGATIVE

## 2023-01-16 MED ORDER — CALCIUM CARBONATE 600 MG PO TABS: 600.0000 mg | ORAL_TABLET | Freq: Two times a day (BID) | ORAL | 12 refills | Status: AC

## 2023-01-16 MED ORDER — VITAMIN E 180 MG (400 UNIT) PO CAPS: 400.0000 [IU] | ORAL_CAPSULE | Freq: Two times a day (BID) | ORAL | 12 refills | Status: DC

## 2023-01-16 NOTE — Progress Notes (Signed)
Subjective:     Katie Beck is a 52 y.o. female here for a routine exam.  Current complaints: last period 11/18/22.  Having hot flashes at night, some periods of feeling down, weight gain.     Gynecologic History Patient's last menstrual period was 11/18/2022. Contraception: condoms Last Mammogram: 12/30/20- normal Last Pap Smear:  01/10/22- negative Last Colon Screening;  never done Seat Belts:   yes Sun Screen:   yes Dental Check Up:  yes Brush & Floss:  yes   Obstetric History OB History  Gravida Para Term Preterm AB Living  2 2 2        $ SAB IAB Ectopic Multiple Live Births               # Outcome Date GA Lbr Len/2nd Weight Sex Delivery Anes PTL Lv  2 Term      CS-LTranv     1 Term      CS-LTranv        The following portions of the patient's history were reviewed and updated as appropriate: allergies, current medications, past family history, past medical history, past social history, past surgical history, and problem list.  Review of Systems Pertinent items noted in HPI and remainder of comprehensive ROS otherwise negative.    Objective:     Vitals:   01/16/23 1402  BP: 117/65  Pulse: 69  Weight: 139 lb (63 kg)  Height: 5' 2.5" (1.588 m)   Vitals:  WNL General appearance: alert, cooperative and no distress  HEENT: Normocephalic, without obvious abnormality, atraumatic Eyes: negative Throat: lips, mucosa, and tongue normal; teeth and gums normal  Respiratory: Clear to auscultation bilaterally  CV: Regular rate and rhythm  Breasts:  Normal appearance, no masses or tenderness, no nipple retraction or dimpling  GI: Soft, non-tender; bowel sounds normal; no masses,  no organomegaly  GU: External Genitalia:  Tanner V, no lesion Urethra:  No prolapse   Vagina: Pink, normal rugae, no blood or discharge  Cervix: No CMT, no lesion  Uterus:  Normal size and contour, non tender  Adnexa: Normal, no masses, non tender  Musculoskeletal: No edema, redness or  tenderness in the calves or thighs  Skin: No lesions or rash  Lymphatic: Axillary adenopathy: none     Psychiatric: Normal mood and behavior        Assessment:    Healthy female exam.    Plan:   Pap up to date Mammogram ordered Referral to [facility] for colonoscopy UPT for 2ndary amenorrhea Fasting labs ordered Hx of vitamin D deficiency--ordered screening test Calcium and Vit prescribed 8.  Anxiety (2) and Depression (7) screenings today-->mild depression.  She will follow up with PCP

## 2023-01-18 ENCOUNTER — Encounter: Payer: Self-pay | Admitting: Internal Medicine

## 2023-01-18 NOTE — Assessment & Plan Note (Signed)
Good sleep habits and conservative measures were discussed. Plan-try temazepam

## 2023-01-18 NOTE — Assessment & Plan Note (Signed)
Watch for seasonal issues like variable allergic rhinitis.  Sleep off flat of back.  Maintain normal body weight.  We discussed various airway management approaches that she may choose to try.  I do not think an ENT surgeon would offer surgery, but she can explore that possibility if this remains a problem.

## 2023-01-19 ENCOUNTER — Ambulatory Visit (INDEPENDENT_AMBULATORY_CARE_PROVIDER_SITE_OTHER): Payer: 59

## 2023-01-19 DIAGNOSIS — Z01419 Encounter for gynecological examination (general) (routine) without abnormal findings: Secondary | ICD-10-CM

## 2023-01-19 DIAGNOSIS — Z1231 Encounter for screening mammogram for malignant neoplasm of breast: Secondary | ICD-10-CM | POA: Diagnosis not present

## 2023-01-19 NOTE — Telephone Encounter (Signed)
I left a message on voicemail for patient to return my call in reference to scheduling surgery. 01/19/23.

## 2023-01-23 ENCOUNTER — Other Ambulatory Visit (HOSPITAL_BASED_OUTPATIENT_CLINIC_OR_DEPARTMENT_OTHER): Payer: Self-pay | Admitting: Orthopaedic Surgery

## 2023-01-23 DIAGNOSIS — S83241A Other tear of medial meniscus, current injury, right knee, initial encounter: Secondary | ICD-10-CM

## 2023-01-23 NOTE — Telephone Encounter (Signed)
I spoke with patient. She is scheduled for surgery on 02/21/23.

## 2023-02-14 ENCOUNTER — Other Ambulatory Visit: Payer: Self-pay

## 2023-02-14 ENCOUNTER — Encounter (HOSPITAL_BASED_OUTPATIENT_CLINIC_OR_DEPARTMENT_OTHER): Payer: Self-pay | Admitting: Orthopaedic Surgery

## 2023-02-14 NOTE — Progress Notes (Signed)
Pt reported a hx of a reaction to local anesthesia during pre op call.  Call placed to Dr Windy Fast office and procedure note faxed back to [place]. Allergy list updated.

## 2023-02-20 NOTE — Anesthesia Preprocedure Evaluation (Signed)
Anesthesia Evaluation  Patient identified by MRN, date of birth, ID band Patient awake    Reviewed: Allergy & Precautions, NPO status , Patient's Chart, lab work & pertinent test results  History of Anesthesia Complications History of anesthetic complications: reaction to local anesthesia.  Airway Mallampati: II  TM Distance: >3 FB Neck ROM: Full    Dental no notable dental hx. (+) Teeth Intact, Dental Advisory Given   Pulmonary neg pulmonary ROS   Pulmonary exam normal breath sounds clear to auscultation       Cardiovascular negative cardio ROS Normal cardiovascular exam Rhythm:Regular Rate:Normal     Neuro/Psych  negative psych ROS   GI/Hepatic negative GI ROS, Neg liver ROS,,,  Endo/Other  negative endocrine ROS    Renal/GU negative Renal ROS     Musculoskeletal negative musculoskeletal ROS (+)    Abdominal   Peds  Hematology negative hematology ROS (+) Lab Results      Component                Value               Date                      WBC                      6.6                 01/12/2022                HGB                      13.2                01/12/2022                HCT                      40.5                01/12/2022                MCV                      90.6                01/12/2022                PLT                      353                 01/12/2022              Anesthesia Other Findings   Reproductive/Obstetrics negative OB ROS                             Anesthesia Physical Anesthesia Plan  ASA: 1  Anesthesia Plan: General and Regional   Post-op Pain Management: Regional block* and Minimal or no pain anticipated   Induction: Intravenous  PONV Risk Score and Plan: 4 or greater and Treatment may vary due to age or medical condition, Midazolam, Ondansetron and Dexamethasone  Airway Management Planned: LMA  Additional Equipment: None  Intra-op  Plan:   Post-operative Plan: Extubation in OR  Informed Consent: I have  reviewed the patients History and Physical, chart, labs and discussed the procedure including the risks, benefits and alternatives for the proposed anesthesia with the patient or authorized representative who has indicated his/her understanding and acceptance.     Dental advisory given  Plan Discussed with:   Anesthesia Plan Comments: (GA w R adductor canal )       Anesthesia Quick Evaluation

## 2023-02-21 ENCOUNTER — Ambulatory Visit (HOSPITAL_BASED_OUTPATIENT_CLINIC_OR_DEPARTMENT_OTHER): Payer: 59 | Admitting: Anesthesiology

## 2023-02-21 ENCOUNTER — Other Ambulatory Visit (HOSPITAL_BASED_OUTPATIENT_CLINIC_OR_DEPARTMENT_OTHER): Payer: Self-pay

## 2023-02-21 ENCOUNTER — Ambulatory Visit (HOSPITAL_BASED_OUTPATIENT_CLINIC_OR_DEPARTMENT_OTHER)
Admission: RE | Admit: 2023-02-21 | Discharge: 2023-02-21 | Disposition: A | Discharge status: Home / Self Care | Payer: 59 | Attending: Orthopaedic Surgery | Admitting: Orthopaedic Surgery

## 2023-02-21 ENCOUNTER — Encounter (HOSPITAL_BASED_OUTPATIENT_CLINIC_OR_DEPARTMENT_OTHER): Payer: Self-pay | Admitting: Orthopaedic Surgery

## 2023-02-21 ENCOUNTER — Encounter (HOSPITAL_BASED_OUTPATIENT_CLINIC_OR_DEPARTMENT_OTHER)
Admission: RE | Disposition: A | Discharge status: Home / Self Care | Payer: Self-pay | Source: Home / Self Care | Attending: Orthopaedic Surgery

## 2023-02-21 ENCOUNTER — Other Ambulatory Visit: Payer: Self-pay

## 2023-02-21 DIAGNOSIS — S83241A Other tear of medial meniscus, current injury, right knee, initial encounter: Secondary | ICD-10-CM | POA: Diagnosis present

## 2023-02-21 DIAGNOSIS — Z01818 Encounter for other preprocedural examination: Secondary | ICD-10-CM

## 2023-02-21 DIAGNOSIS — X58XXXA Exposure to other specified factors, initial encounter: Secondary | ICD-10-CM | POA: Insufficient documentation

## 2023-02-21 DIAGNOSIS — Z79899 Other long term (current) drug therapy: Secondary | ICD-10-CM | POA: Insufficient documentation

## 2023-02-21 HISTORY — DX: Other complications of anesthesia, initial encounter: T88.59XA

## 2023-02-21 HISTORY — PX: KNEE ARTHROSCOPY WITH MENISCAL REPAIR: SHX5653

## 2023-02-21 LAB — POCT PREGNANCY, URINE: Preg Test, Ur: NEGATIVE

## 2023-02-21 SURGERY — ARTHROSCOPY, KNEE, WITH MENISCUS REPAIR
Anesthesia: Regional | Site: Knee | Laterality: Right

## 2023-02-21 MED ORDER — ACETAMINOPHEN 10 MG/ML IV SOLN
1000.0000 mg | Freq: Once | INTRAVENOUS | Status: DC | PRN
  Administered 2023-02-21: 1000 mg via INTRAVENOUS

## 2023-02-21 MED ORDER — FENTANYL CITRATE (PF) 100 MCG/2ML IJ SOLN
100.0000 ug | Freq: Once | INTRAMUSCULAR | Status: AC
  Administered 2023-02-21: 50 ug via INTRAVENOUS

## 2023-02-21 MED ORDER — LACTATED RINGERS IV SOLN: INTRAVENOUS | Status: DC

## 2023-02-21 MED ORDER — PROPOFOL 500 MG/50ML IV EMUL
INTRAVENOUS | Status: AC
  Filled 2023-02-21: qty 50

## 2023-02-21 MED ORDER — HYDROMORPHONE HCL 1 MG/ML IJ SOLN
0.2500 mg | INTRAMUSCULAR | Status: DC | PRN
  Administered 2023-02-21 (×2): 0.25 mg via INTRAVENOUS

## 2023-02-21 MED ORDER — MIDAZOLAM HCL 2 MG/2ML IJ SOLN
INTRAMUSCULAR | Status: AC
  Filled 2023-02-21: qty 2

## 2023-02-21 MED ORDER — LIDOCAINE 2% (20 MG/ML) 5 ML SYRINGE
INTRAMUSCULAR | Status: AC
  Filled 2023-02-21: qty 5

## 2023-02-21 MED ORDER — ROPIVACAINE HCL 5 MG/ML IJ SOLN
INTRAMUSCULAR | Status: DC | PRN
  Administered 2023-02-21: 30 mL via PERINEURAL

## 2023-02-21 MED ORDER — MIDAZOLAM HCL 2 MG/2ML IJ SOLN
2.0000 mg | Freq: Once | INTRAMUSCULAR | Status: AC
  Administered 2023-02-21: 1 mg via INTRAVENOUS

## 2023-02-21 MED ORDER — ACETAMINOPHEN 10 MG/ML IV SOLN
INTRAVENOUS | Status: AC
  Filled 2023-02-21: qty 100

## 2023-02-21 MED ORDER — EPHEDRINE SULFATE (PRESSORS) 50 MG/ML IJ SOLN
INTRAMUSCULAR | Status: DC | PRN
  Administered 2023-02-21: 10 mg via INTRAVENOUS
  Administered 2023-02-21 (×2): 5 mg via INTRAVENOUS

## 2023-02-21 MED ORDER — FENTANYL CITRATE (PF) 100 MCG/2ML IJ SOLN
INTRAMUSCULAR | Status: AC
  Filled 2023-02-21: qty 2

## 2023-02-21 MED ORDER — ACETAMINOPHEN 500 MG PO TABS
500.0000 mg | ORAL_TABLET | Freq: Three times a day (TID) | ORAL | 0 refills | Status: AC
  Filled 2023-02-21: qty 30, 10d supply, fill #0

## 2023-02-21 MED ORDER — DEXMEDETOMIDINE HCL IN NACL 80 MCG/20ML IV SOLN
INTRAVENOUS | Status: DC | PRN
  Administered 2023-02-21: 8 ug via BUCCAL

## 2023-02-21 MED ORDER — OXYCODONE HCL 5 MG PO TABS: 5.0000 mg | ORAL_TABLET | Freq: Once | ORAL | Status: DC | PRN

## 2023-02-21 MED ORDER — EPINEPHRINE PF 1 MG/ML IJ SOLN
INTRAMUSCULAR | Status: AC
  Filled 2023-02-21: qty 1

## 2023-02-21 MED ORDER — PROPOFOL 10 MG/ML IV BOLUS
INTRAVENOUS | Status: DC | PRN
  Administered 2023-02-21: 130 mg via INTRAVENOUS

## 2023-02-21 MED ORDER — IBUPROFEN 800 MG PO TABS
800.0000 mg | ORAL_TABLET | Freq: Three times a day (TID) | ORAL | 0 refills | Status: AC
  Filled 2023-02-21: qty 30, 10d supply, fill #0

## 2023-02-21 MED ORDER — ONDANSETRON HCL 4 MG/2ML IJ SOLN
INTRAMUSCULAR | Status: AC
  Filled 2023-02-21: qty 2

## 2023-02-21 MED ORDER — OXYCODONE HCL 5 MG PO TABS
5.0000 mg | ORAL_TABLET | ORAL | 0 refills | Status: DC | PRN
  Filled 2023-02-21: qty 10, 2d supply, fill #0

## 2023-02-21 MED ORDER — FENTANYL CITRATE (PF) 100 MCG/2ML IJ SOLN
INTRAMUSCULAR | Status: DC | PRN
  Administered 2023-02-21 (×4): 25 ug via INTRAVENOUS

## 2023-02-21 MED ORDER — DEXAMETHASONE SODIUM PHOSPHATE 10 MG/ML IJ SOLN
INTRAMUSCULAR | Status: AC
  Filled 2023-02-21: qty 1

## 2023-02-21 MED ORDER — SODIUM CHLORIDE 0.9 % IR SOLN: Status: DC | PRN

## 2023-02-21 MED ORDER — OXYCODONE HCL 5 MG/5ML PO SOLN: 5.0000 mg | Freq: Once | ORAL | Status: DC | PRN

## 2023-02-21 MED ORDER — LIDOCAINE HCL (CARDIAC) PF 100 MG/5ML IV SOSY
PREFILLED_SYRINGE | INTRAVENOUS | Status: DC | PRN
  Administered 2023-02-21: 80 mg via INTRAVENOUS

## 2023-02-21 MED ORDER — DEXAMETHASONE SODIUM PHOSPHATE 4 MG/ML IJ SOLN
INTRAMUSCULAR | Status: DC | PRN
  Administered 2023-02-21: 4 mg via INTRAVENOUS

## 2023-02-21 MED ORDER — ONDANSETRON HCL 4 MG/2ML IJ SOLN: 4.0000 mg | Freq: Once | INTRAMUSCULAR | Status: DC | PRN

## 2023-02-21 MED ORDER — TRANEXAMIC ACID-NACL 1000-0.7 MG/100ML-% IV SOLN
INTRAVENOUS | Status: DC | PRN
  Administered 2023-02-21: 1000 mg via INTRAVENOUS

## 2023-02-21 MED ORDER — ASPIRIN 325 MG PO TBEC
325.0000 mg | DELAYED_RELEASE_TABLET | Freq: Every day | ORAL | 0 refills | Status: DC
  Filled 2023-02-21: qty 30, 30d supply, fill #0

## 2023-02-21 MED ORDER — BUPIVACAINE HCL (PF) 0.25 % IJ SOLN
INTRAMUSCULAR | Status: AC
  Filled 2023-02-21: qty 30

## 2023-02-21 MED ORDER — AMISULPRIDE (ANTIEMETIC) 5 MG/2ML IV SOLN: 10.0000 mg | Freq: Once | INTRAVENOUS | Status: DC | PRN

## 2023-02-21 MED ORDER — HYDROMORPHONE HCL 1 MG/ML IJ SOLN
INTRAMUSCULAR | Status: AC
  Filled 2023-02-21: qty 0.5

## 2023-02-21 MED ORDER — CEFAZOLIN SODIUM-DEXTROSE 2-4 GM/100ML-% IV SOLN
INTRAVENOUS | Status: AC
  Filled 2023-02-21: qty 100

## 2023-02-21 SURGICAL SUPPLY — 61 items
APL PRP STRL LF DISP 70% ISPRP (MISCELLANEOUS) ×1
BANDAGE ESMARK 6X9 LF (GAUZE/BANDAGES/DRESSINGS) IMPLANT
BLADE EXCALIBUR 4.0X13 (MISCELLANEOUS) IMPLANT
BLADE SURG 15 STRL LF DISP TIS (BLADE) IMPLANT
BLADE SURG 15 STRL SS (BLADE) ×1
BNDG CMPR 5X62 HK CLSR LF (GAUZE/BANDAGES/DRESSINGS) ×1
BNDG CMPR 6"X 5 YARDS HK CLSR (GAUZE/BANDAGES/DRESSINGS) ×1
BNDG CMPR 9X6 STRL LF SNTH (GAUZE/BANDAGES/DRESSINGS) ×1
BNDG ELASTIC 4X5.8 VLCR STR LF (GAUZE/BANDAGES/DRESSINGS) ×1 IMPLANT
BNDG ELASTIC 6INX 5YD STR LF (GAUZE/BANDAGES/DRESSINGS) ×1 IMPLANT
BNDG ESMARK 6X9 LF (GAUZE/BANDAGES/DRESSINGS) ×1
CHLORAPREP W/TINT 26 (MISCELLANEOUS) ×1 IMPLANT
COOLER ICEMAN CLASSIC (MISCELLANEOUS) ×1 IMPLANT
CUFF TOURN SGL QUICK 34 (TOURNIQUET CUFF) ×1
CUFF TRNQT CYL 34X4.125X (TOURNIQUET CUFF) IMPLANT
DISSECTOR  3.8MM X 13CM (MISCELLANEOUS) ×1
DISSECTOR 3.8MM X 13CM (MISCELLANEOUS) ×1 IMPLANT
DRAPE ARTHROSCOPY W/POUCH 90 (DRAPES) ×1 IMPLANT
DRAPE IMP U-DRAPE 54X76 (DRAPES) ×1 IMPLANT
DRAPE INCISE IOBAN 66X45 STRL (DRAPES) IMPLANT
DRAPE U-SHAPE 47X51 STRL (DRAPES) ×1 IMPLANT
DW OUTFLOW CASSETTE/TUBE SET (MISCELLANEOUS) ×1 IMPLANT
ELECT REM PT RETURN 9FT ADLT (ELECTROSURGICAL) ×1
ELECTRODE REM PT RTRN 9FT ADLT (ELECTROSURGICAL) IMPLANT
EXCALIBUR 3.8MM X 13CM (MISCELLANEOUS) IMPLANT
GAUZE 4X4 16PLY ~~LOC~~+RFID DBL (SPONGE) IMPLANT
GAUZE PAD ABD 8X10 STRL (GAUZE/BANDAGES/DRESSINGS) ×1 IMPLANT
GAUZE SPONGE 4X4 12PLY STRL (GAUZE/BANDAGES/DRESSINGS) ×1 IMPLANT
GAUZE XEROFORM 1X8 LF (GAUZE/BANDAGES/DRESSINGS) ×1 IMPLANT
GLOVE BIO SURGEON STRL SZ 6 (GLOVE) ×1 IMPLANT
GLOVE BIO SURGEON STRL SZ7.5 (GLOVE) ×1 IMPLANT
GLOVE BIOGEL PI IND STRL 6.5 (GLOVE) ×1 IMPLANT
GLOVE BIOGEL PI IND STRL 8 (GLOVE) ×1 IMPLANT
GOWN STRL REUS W/ TWL LRG LVL3 (GOWN DISPOSABLE) ×1 IMPLANT
GOWN STRL REUS W/ TWL XL LVL3 (GOWN DISPOSABLE) ×1 IMPLANT
GOWN STRL REUS W/TWL LRG LVL3 (GOWN DISPOSABLE) ×1
GOWN STRL REUS W/TWL XL LVL3 (GOWN DISPOSABLE) ×1
KIT ROOT REPAIR MEINISCAL PEEK (Anchor) IMPLANT
LOOP 2 FIBERLINK CLOSED (SUTURE) IMPLANT
MANIFOLD NEPTUNE II (INSTRUMENTS) ×1 IMPLANT
MEINISCAL ROOT REPAIR KIT PEEK (Anchor) ×1 IMPLANT
NDL HYPO 18GX1.5 BLUNT FILL (NEEDLE) ×1 IMPLANT
NDL SAFETY ECLIP 18X1.5 (MISCELLANEOUS) ×1 IMPLANT
NDL SUT 2-0 SCORPION KNEE (NEEDLE) IMPLANT
NEEDLE HYPO 18GX1.5 BLUNT FILL (NEEDLE) ×1 IMPLANT
NEEDLE SUT 2-0 SCORPION KNEE (NEEDLE) IMPLANT
PACK ARTHROSCOPY DSU (CUSTOM PROCEDURE TRAY) ×1 IMPLANT
PACK BASIN DAY SURGERY FS (CUSTOM PROCEDURE TRAY) ×1 IMPLANT
PAD COLD SHLDR WRAP-ON (PAD) ×1 IMPLANT
PADDING CAST COTTON 6X4 STRL (CAST SUPPLIES) ×1 IMPLANT
PENCIL SMOKE EVACUATOR (MISCELLANEOUS) IMPLANT
PORT APPOLLO RF 90DEGREE MULTI (SURGICAL WAND) ×1 IMPLANT
SLEEVE SCD COMPRESS KNEE MED (STOCKING) ×1 IMPLANT
SUT ETHILON 3 0 PS 1 (SUTURE) ×1 IMPLANT
SUT VIC AB 2-0 SH 27 (SUTURE) ×1
SUT VIC AB 2-0 SH 27XBRD (SUTURE) IMPLANT
SUT VIC AB 3-0 FS2 27 (SUTURE) IMPLANT
SYR 5ML LL (SYRINGE) ×1 IMPLANT
TOWEL GREEN STERILE FF (TOWEL DISPOSABLE) ×2 IMPLANT
TRAY DSU PREP LF (CUSTOM PROCEDURE TRAY) ×1 IMPLANT
TUBING ARTHROSCOPY IRRIG 16FT (MISCELLANEOUS) ×1 IMPLANT

## 2023-02-21 NOTE — Interval H&P Note (Signed)
History and Physical Interval Note:  02/21/2023 8:01 AM  Katie Beck  has presented today for surgery, with the diagnosis of RIGHT MEDIAL MENISCUS TEAR.  The various methods of treatment have been discussed with the patient and family. After consideration of risks, benefits and other options for treatment, the patient has consented to  Procedure(s): RIGHT KNEE ARTHROSCOPY WITH MEDIAL MENISCAL ROOT  REPAIR (Right) as a surgical intervention.  The patient's history has been reviewed, patient examined, no change in status, stable for surgery.  I have reviewed the patient's chart and labs.  Questions were answered to the patient's satisfaction.     Vanetta Mulders

## 2023-02-21 NOTE — Anesthesia Procedure Notes (Signed)
Anesthesia Regional Block: Adductor canal block   Pre-Anesthetic Checklist: , timeout performed,  Correct Patient, Correct Site, Correct Laterality,  Correct Procedure, Correct Position, site marked,  Risks and benefits discussed,  Surgical consent,  Pre-op evaluation,  At surgeon's request and post-op pain management  Laterality: Lower and Right  Prep: chloraprep       Needles:  Injection technique: Single-shot  Needle Type: Echogenic Needle     Needle Length: 9cm  Needle Gauge: 22     Additional Needles:   Procedures:,,,, ultrasound used (permanent image in chart),,    Narrative:  Start time: 02/21/2023 7:50 AM End time: 02/21/2023 7:57 AM Injection made incrementally with aspirations every 5 mL.  Performed by: Personally  Anesthesiologist: Barnet Glasgow, MD  Additional Notes: Block assessed prior to surgery. Pt tolerated procedure well.

## 2023-02-21 NOTE — Transfer of Care (Signed)
Immediate Anesthesia Transfer of Care Note  Patient: Katie Beck  Procedure(s) Performed: RIGHT KNEE ARTHROSCOPY WITH MEDIAL MENISCAL ROOT  REPAIR (Right: Knee)  Patient Location: PACU  Anesthesia Type:GA combined with regional for post-op pain  Level of Consciousness: sedated  Airway & Oxygen Therapy: Patient Spontanous Breathing and Patient connected to face mask oxygen  Post-op Assessment: Report given to RN and Post -op Vital signs reviewed and stable  Post vital signs: Reviewed and stable  Last Vitals:  Vitals Value Taken Time  BP    Temp    Pulse 66 02/21/23 0918  Resp 17 02/21/23 0918  SpO2 100 % 02/21/23 0918  Vitals shown include unvalidated device data.  Last Pain:  Vitals:   02/21/23 0726  TempSrc: Temporal  PainSc: 4       Patients Stated Pain Goal: 3 ([id number])  Complications: No notable events documented.

## 2023-02-21 NOTE — Discharge Instructions (Addendum)
Discharge Instructions    Attending Surgeon: Vanetta Mulders, MD Office Phone Number: [phone]   Diagnosis and Procedures:    Surgeries Performed: Right knee medial meniscal root repair  Discharge Plan:    Diet: Resume usual diet. Begin with light or bland foods.  Drink plenty of fluids.  Activity:  Touchdown weightbearing with brace locked in extension for 2 weeks.  May use crutches for ambulation You are advised to go home directly from the hospital or surgical center. Restrict your activities.  GENERAL INSTRUCTIONS: 1.  Please apply ice to your wound to help with swelling and inflammation. This will improve your comfort and your overall recovery following surgery.     2. Please call Dr. Eddie Dibbles office at [phone] with questions Monday-Friday during business hours. If no one answers, please leave a message and someone should get back to the patient within 24 hours. For emergencies please call 911 or proceed to the emergency room.   3. Patient to notify surgical team if experiences any of the following: Bowel/Bladder dysfunction, uncontrolled pain, nerve/muscle weakness, incision with increased drainage or redness, nausea/vomiting and Fever greater than 101.0 F.  Be alert for signs of infection including redness, streaking, odor, fever or chills. Be alert for excessive pain or bleeding and notify your surgeon immediately.  WOUND INSTRUCTIONS:   Leave your dressing, cast, or splint in place until your post operative visit.  Keep it clean and dry.  Always keep the incision clean and dry until the staples/sutures are removed. If there is no drainage from the incision you should keep it open to air. If there is drainage from the incision you must keep it covered at all times until the drainage stops  Do not soak in a bath tub, hot tub, pool, lake or other body of water until 21 days after your surgery and your incision is completely dry and healed.  If you have  removable sutures (or staples) they must be removed 10-14 days (unless otherwise instructed) from the day of your surgery.     1)  Elevate the extremity as much as possible.  2)  Keep the dressing clean and dry.  3)  Please call us if the dressing becomes wet or dirty.  4)  If you are experiencing worsening pain or worsening swelling, please call.     MEDICATIONS: Resume all previous home medications at the previous prescribed dose and frequency unless otherwise noted Start taking the  pain medications on an as-needed basis as prescribed  Please taper down pain medication over the next week following surgery.  Ideally you should not require a refill of any narcotic pain medication.  Take pain medication with food to minimize nausea. In addition to the prescribed pain medication, you may take over-the-counter pain relievers such as Tylenol.  Do NOT take additional tylenol if your pain medication already has tylenol in it.  Aspirin 325mg  daily for four weeks.      FOLLOWUP INSTRUCTIONS: 1. Follow up at the Physical Therapy Clinic 3-4 days following surgery. This appointment should be scheduled unless other arrangements have been made.The Physical Therapy scheduling number is [phone] if an appointment has not already been arranged.  2. Contact Dr. Eddie Dibbles office during office hours at [phone] or the practice after hours line at [phone] for non-emergencies. For medical emergencies call 911.   Discharge Location: Home   You may have Tylenol again after 3:40pm today, if needed  Post Anesthesia Home Care Instructions  Activity:  Get plenty of rest for the remainder of the day. A responsible individual must stay with you for 24 hours following the procedure.  For the next 24 hours, DO NOT: -Drive a car -Paediatric nurse -Drink alcoholic beverages -Take any medication unless instructed by your physician -Make any legal decisions or sign important  papers.  Meals: Start with liquid foods such as gelatin or soup. Progress to regular foods as tolerated. Avoid greasy, spicy, heavy foods. If nausea and/or vomiting occur, drink only clear liquids until the nausea and/or vomiting subsides. Call your physician if vomiting continues.  Special Instructions/Symptoms: Your throat may feel dry or sore from the anesthesia or the breathing tube placed in your throat during surgery. If this causes discomfort, gargle with warm salt water. The discomfort should disappear within 24 hours.  If you had a scopolamine patch placed behind your ear for the management of post- operative nausea and/or vomiting:  1. The medication in the patch is effective for 72 hours, after which it should be removed.  Wrap patch in a tissue and discard in the trash. Wash hands thoroughly with soap and water. 2. You may remove the patch earlier than 72 hours if you experience unpleasant side effects which may include dry mouth, dizziness or visual disturbances. 3. Avoid touching the patch. Wash your hands with soap and water after contact with the patch.   Regional Anesthesia Blocks  1. Numbness or the inability to move the "blocked" extremity may last from 3-48 hours after placement. The length of time depends on the medication injected and your individual response to the medication. If the numbness is not going away after 48 hours, call your surgeon.  2. The extremity that is blocked will need to be protected until the numbness is gone and the  Strength has returned. Because you cannot feel it, you will need to take extra care to avoid injury. Because it may be weak, you may have difficulty moving it or using it. You may not know what position it is in without looking at it while the block is in effect.  3. For blocks in the legs and feet, returning to weight bearing and walking needs to be done carefully. You will need to wait until the numbness is entirely gone and the strength  has returned. You should be able to move your leg and foot normally before you try and bear weight or walk. You will need someone to be with you when you first try to ensure you do not fall and possibly risk injury.  4. Bruising and tenderness at the needle site are common side effects and will resolve in a few days.  5. Persistent numbness or new problems with movement should be communicated to the surgeon or the [facility] [phone] [place] [phone]).

## 2023-02-21 NOTE — Anesthesia Postprocedure Evaluation (Signed)
Anesthesia Post Note  Patient: Dietitian  Procedure(s) Performed: RIGHT KNEE ARTHROSCOPY WITH MEDIAL MENISCAL ROOT  REPAIR (Right: Knee)     Patient location during evaluation: PACU Anesthesia Type: Regional and General Level of consciousness: awake and alert Pain management: pain level controlled Vital Signs Assessment: post-procedure vital signs reviewed and stable Respiratory status: spontaneous breathing, nonlabored ventilation, respiratory function stable and patient connected to nasal cannula oxygen Cardiovascular status: blood pressure returned to baseline and stable Postop Assessment: no apparent nausea or vomiting Anesthetic complications: no  No notable events documented.  Last Vitals:  Vitals:   02/21/23 1026 02/21/23 1053  BP: 114/65   Pulse: (!) 55   Resp:    Temp: (!) 36.3 C (!) 36.4 C  SpO2: 100%     Last Pain:  Vitals:   02/21/23 1053  TempSrc: Temporal  PainSc:                  Barnet Glasgow

## 2023-02-21 NOTE — Op Note (Signed)
Date of Surgery: 02/21/2023  INDICATIONS: Ms. Walloch is a 52 y.o.-year-old female with right knee medial meniscal root tear.  The risk and benefits of the procedure were discussed in detail and documented in the pre-operative evaluation.   PREOPERATIVE DIAGNOSIS: 1.  Right knee medial meniscal root tear  POSTOPERATIVE DIAGNOSIS: Same.  PROCEDURE: 1.  Right knee medial meniscal root repair  SURGEON: Yevonne Pax MD  ASSISTANT: Raynelle Fanning, ATC  ANESTHESIA:  general plus adductor canal block  IV FLUIDS AND URINE: See anesthesia record.  ANTIBIOTICS: Ancef  ESTIMATED BLOOD LOSS: 10 mL.  IMPLANTS:  Implant Name Type Inv. Item Serial No. Manufacturer Lot No. LRB No. Used Action  MEINISCAL ROOT REPAIR KIT PEEK - [id number] Anchor MEINISCAL ROOT REPAIR KIT PEEK  [employer] [id number] Right 1 Implanted    DRAINS: None  CULTURES: None  COMPLICATIONS: none  DESCRIPTION OF PROCEDURE:  Examination under anesthesia: A careful examination under anesthesia was performed.  Knee ROM motion was: -3 - 135 Lachman: Normal Pivot Shift: Normal Posterior drawer: normal.   Varus stability in full extension: normal.   Varus stability in 30 degrees of flexion: normal.  Valgus stability in full extension: normal.   Valgus stability in 30 degrees of flexion: normal.  Posterolateral drawer: normal   Intra-operative findings: A thorough arthroscopic examination of the knee was performed.  The findings are: 1. Suprapatellar pouch: Normal 2. Undersurface of median ridge: Normal 3. Medial patellar facet: Normal 4. Lateral patellar facet: Normal 5. Trochlea: Normal 6. Lateral gutter/popliteus tendon: Normal 7. Hoffa's fat pad: Normal 8. Medial gutter/plica: Normal 9. ACL: Normal 10. PCL: Normal 11. Medial meniscus: Posterior root tear complete 12. Medial compartment cartilage: Grade 2 cartilage changes involving the femur and grade 1 cartilage changes involving the tibia centrally 13.  Lateral meniscus: Normal 14. Lateral compartment cartilage: Normal  I identified the patient in the pre-operative holding area.  I marked the operative knee with my initials. I reviewed the risks and benefits of the proposed surgical intervention and the patient wished to proceed.  Anesthesia performed a peripheral nerve block.  Patient was subsequently taken back to the operating room.  The patient was transferred to the operative suite and placed in the supine position with all bony prominences padded.     SCDs were placed on the non-operative lower extremity. Appropriate antibiotics was administered within 1 hour before incision. The operative lower extremity was then prepped and draped in standard fashion. A time out was performed confirming the correct extremity, correct patient and correct procedure.    A standard anterolateral portal was made with an 11 blade.  The ideal position for the anteromedial portal was established using a spinal needle.  This AM portal was then created under direct visualization with an 11 blade.  A full diagnostic arthroscopy was then performed, as described above, including probing of the chondral and meniscal surfaces.     The meniscus root repair was then performed.  A knee scorpion from [employer] was then used to place two 0 non-absorbable sutures through the torn posterior horn meniscus 1.5 cm medial to the posterior root. The 2 free ends of the suture were passed through the loop, creating a locking stitch/luggage tag.   A tibial tunnel was created at the media meniscal root attachment using an ACL guide. Once the guide was positioned intra-articularly at the center of the meniscal root, the [employer] retrocutter was introduced up through the footprint of the root.  The sleeve was malleted  into place and a fiber stick was then placed.  The sutures were retrieved through the medial portal.  These were used to pass the sutures in the root.  Care was taken to ensure that  no suture bridge had formed.  The sutures of the meniscal root were then inserted into the tibia using a 4.75 mm swivel lock.  This was done by first drilling into the tibial bone and subsequently tapping the bone.  The sutures were tensioned and the anchor was placed.  Arthroscopy confirmed anatomic reduction of the root of the meniscus with good tension.  That concluded the case.  Skin was closed with 2-0 Vicryl and 3-0 nylon. Xeroform gauze, gauze, Tegaderm, Iceman and brace were applied.  Instrument, sponge, and needle counts were correct prior to wound closure and at the conclusion of the case.  The patient was taken to the PACU without complication     POSTOPERATIVE PLAN: She will be touchdown weightbearing for a total of 2 weeks with her brace locked in extension.  She will begin physical therapy.  She was placed on aspirin for blood clot prevention.  I will see her back in 2 weeks for wound check and suture removal.  Yevonne Pax, MD 9:20 AM

## 2023-02-21 NOTE — H&P (Signed)
Chief Complaint: Right knee pain        History of Present Illness:      Katie Beck is a 52 y.o. female presents today with ongoing right knee pain which has been worse since the November December timeframe when she began ramping up her Zumba.  She does enjoy being active.  Unfortunately this has been quite limited even in terms of basic walking as result of this injury.  She is here today as a referral from Dr. Georgina Snell as she does have a meniscal root tear.  She has not had any injections.  She has been working with physical therapy with limited relief.  She is here today with her husband.       Surgical History:   None   PMH/PSH/Family History/Social History/Meds/Allergies:         Past Medical History:  Diagnosis Date   Anemia 10/30/2012   CTS (carpal tunnel syndrome)     PCO (polycystic ovaries)      conceived on metformin   Vestibular neuronitis 07/27/14    ED visit for vertigo: CT angio neck normal   Vitamin D deficiency 10/30/2012         Past Surgical History:  Procedure Laterality Date   CESAREAN SECTION   2009 &2002    Social History         Socioeconomic History   Marital status: Married      Spouse name: Not on file   Number of children: Not on file   Years of education: Not on file   Highest education level: Not on file  Occupational History   Occupation: IT  Tobacco Use   Smoking status: Never   Smokeless tobacco: Never  Vaping Use   Vaping Use: Never used  Substance and Sexual Activity   Alcohol use: No   Drug use: No   Sexual activity: Yes      Partners: Male      Birth control/protection: None  Other Topics Concern   Not on file  Social History Narrative    [place] country of origin         works outside [employer]       [place] of 4 (52 y/o, 50 y/o + married).          pets no         NO ets or tobacco.          Vegetarian       Smoking Status:  never       Caffeine use/day:  3-4       Does  Patient Exercise:  no              Social Determinants of Health    Financial Resource Strain: Not on file  Food Insecurity: Not on file  Transportation Needs: Not on file  Physical Activity: Not on file  Stress: Not on file  Social Connections: Not on file         Family History  Problem Relation Age of Onset   Diabetes Father     Hypertension Father     Hypertension Sister     Diabetes Sister     Hypertension Mother     Diabetes Mother     Hyperlipidemia Mother     Thyroid disease Mother     Diabetes Maternal Grandfather     Pancreatic cancer Brother  pancreatic    No Known Allergies       Current Outpatient Medications  Medication Sig Dispense Refill   acetaminophen (TYLENOL) 500 MG tablet Take 1,000 mg by mouth every 6 (six) hours as needed for mild pain.       cetirizine (ZYRTEC) 10 MG tablet Take 10 mg by mouth daily as needed for allergies.       LORazepam (ATIVAN) 0.5 MG tablet 1-2 tabs 30 - 60 min prior to MRI. Do not drive with this medicine. 4 tablet 0   Multiple Vitamin (MULTIVITAMIN) tablet Take 1 tablet by mouth daily.       temazepam (RESTORIL) 15 MG capsule Take 1 capsule (15 mg total) by mouth at bedtime as needed for sleep. 30 capsule 2   Vitamin D, Ergocalciferol, (DRISDOL) 1.25 MG (50000 UNIT) CAPS capsule TAKE 1 CAPSULE (50,000 UNITS TOTAL) BY MOUTH EVERY 7 (SEVEN) DAYS 4 capsule 0    No current facility-administered medications for this visit.    Imaging Results (Last 48 hours)  No results found.     Review of Systems:   A ROS was performed including pertinent positives and negatives as documented in the HPI.   Physical Exam :   Constitutional: NAD and appears stated age Neurological: Alert and oriented Psych: Appropriate affect and cooperative Last menstrual period 11/18/2022.    Comprehensive Musculoskeletal Exam:           Musculoskeletal Exam  Gait Normal  Alignment Normal    Right Left  Inspection Normal Normal   Palpation      Tenderness Medial joint line none  Crepitus None None  Effusion None None  Range of Motion      Extension 0 0  Flexion 135 135  Strength      Extension 5/5 5/5  Flexion 5/5 5/5  Ligament Exam        Generalized Laxity No No  Lachman Negative Negative   Pivot Shift Negative Negative  Anterior Drawer Negative Negative  Valgus at 0 Negative Negative  Valgus at 20 Negative Negative  Varus at 0 0 0  Varus at 20   0 0  Posterior Drawer at 90 0 0  Vascular/Lymphatic Exam      Edema None None  Venous Stasis Changes No No  Distal Circulation Normal Normal  Neurologic      Light Touch Sensation Intact Intact  Special Tests: Positive McMurray medially        Imaging:   Xray (4 views right knee): Normal   MRI (right knee MRI): There is a full-thickness posterior meniscal root tear   I personally reviewed and interpreted the radiographs.     Assessment:   52 y.o. female with a right knee medial meniscal root tear after she has become more active and exercising.  I did describe the natural progression of root tears.  I did describe that in longitudinal studies how there has been shown to be an increased risk and progression to arthritis with unaddressed meniscal root tears.  We did discuss the implications of this.  I did discuss that ultimately I would recommend surgical treatment for root fixation in order to improve her long-term outcome.  I did understand the implications of both operative and nonoperative treatment.  She would like to further discuss options with her husband and she will let us know should she further want to consider right knee medial meniscal root repair   Plan :     -plan for right knee  medial meniscal root repair    After a lengthy discussion of treatment options, including risks, benefits, alternatives, complications of surgical and nonsurgical conservative options, the patient elected surgical repair.   The patient  is aware of the  material risks  and complications including, but not limited to injury to adjacent structures, neurovascular injury, infection, numbness, bleeding, implant failure, thermal burns, stiffness, persistent pain, failure to heal, disease transmission from allograft, need for further surgery, dislocation, anesthetic risks, blood clots, risks of death,and others. The probabilities of surgical success and failure discussed with patient given their particular co-morbidities.The time and nature of expected rehabilitation and recovery was discussed.The patient's questions were all answered preoperatively.  No barriers to understanding were noted. I explained the natural history of the disease process and Rx rationale.  I explained to the patient what I considered to be reasonable expectations given their personal situation.  The final treatment plan was arrived at through a shared patient decision making process model.        I personally saw and evaluated the patient, and participated in the management and treatment plan.   Vanetta Mulders, MD Attending Physician, Orthopedic Surgery

## 2023-02-21 NOTE — Progress Notes (Signed)
Orthopedic Tech Progress Note Patient Details:  Katie Beck [birth year] [id number]  Day surgery RN called requesting a ROM BRACE for patient. Called in STAT order to [facility]   Patient ID: [name], female   DOB: [birth year], 52 y.o.   MRN: [id number]  Janit Pagan 02/21/2023, 9:20 AM

## 2023-02-21 NOTE — Progress Notes (Signed)
Assisted Dr. Houser with right, adductor canal, ultrasound guided block. Side rails up, monitors on throughout procedure. See vital signs in flow sheet. Tolerated Procedure well. 

## 2023-02-21 NOTE — Brief Op Note (Signed)
   Brief Op Note  Date of Surgery: 02/21/2023  Preoperative Diagnosis: RIGHT MEDIAL MENISCUS TEAR  Postoperative Diagnosis: same  Procedure: Procedure(s): RIGHT KNEE ARTHROSCOPY WITH MEDIAL MENISCAL ROOT  REPAIR  Implants: Implant Name Type Inv. Item Serial No. Manufacturer Lot No. LRB No. Used Action  MEINISCAL ROOT REPAIR KIT PEEK - [id number] Anchor MEINISCAL ROOT REPAIR KIT PEEK  [employer] [id number] Right 1 Implanted    Surgeons: Surgeon(s): Vanetta Mulders, MD  Anesthesia: Regional    Estimated Blood Loss: See anesthesia record  Complications: None  Condition to PACU: Stable  Yevonne Pax, MD 02/21/2023 9:18 AM

## 2023-02-21 NOTE — Anesthesia Procedure Notes (Signed)
Procedure Name: LMA Insertion Date/Time: 02/21/2023 8:16 AM  Performed by: Maryella Shivers, CRNAPre-anesthesia Checklist: Patient identified, Emergency Drugs available, Suction available and Patient being monitored Patient Re-evaluated:Patient Re-evaluated prior to induction Oxygen Delivery Method: Circle system utilized Preoxygenation: Pre-oxygenation with 100% oxygen Induction Type: IV induction Ventilation: Mask ventilation without difficulty LMA: LMA inserted LMA Size: 4.0 Number of attempts: 1 Airway Equipment and Method: Bite block Placement Confirmation: positive ETCO2 Tube secured with: Tape Dental Injury: Teeth and Oropharynx as per pre-operative assessment

## 2023-02-22 ENCOUNTER — Encounter (HOSPITAL_BASED_OUTPATIENT_CLINIC_OR_DEPARTMENT_OTHER): Payer: Self-pay | Admitting: Orthopaedic Surgery

## 2023-02-23 ENCOUNTER — Encounter (HOSPITAL_BASED_OUTPATIENT_CLINIC_OR_DEPARTMENT_OTHER): Payer: Self-pay | Admitting: Orthopaedic Surgery

## 2023-02-26 NOTE — Therapy (Signed)
OUTPATIENT PHYSICAL THERAPY LOWER EXTREMITY EVALUATION   Patient Name: Katie Beck MRN: [id number] DOB:04/20/1971, 52 y.o., female Today's Date: 02/28/2023  END OF SESSION:  PT End of Session - 02/28/23 0909     Visit Number 1    Number of Visits 29    Date for PT Re-Evaluation 05/22/23    Authorization Type [employer]    PT Start Time 0807    PT Stop Time 0859    PT Time Calculation (min) 52 min    Activity Tolerance Patient tolerated treatment well    Behavior During Therapy [facility] for tasks assessed/performed             Past Medical History:  Diagnosis Date   Anemia [id number]   Complication of anesthesia    Reaction to Septocaine w/ epi during dental procedure in office- lightheaded, palpitations/anxiety   CTS (carpal tunnel syndrome)    Medial meniscus tear    root tear 09/2022   PCO (polycystic ovaries)    conceived on metformin   Vestibular neuronitis 07/27/2014   ED visit for vertigo: CT angio neck normal   Vitamin D deficiency 10/30/2012   Past Surgical History:  Procedure Laterality Date   CESAREAN SECTION  2009 &2002   KNEE ARTHROSCOPY WITH MENISCAL REPAIR Right 02/21/2023   Procedure: RIGHT KNEE ARTHROSCOPY WITH MEDIAL MENISCAL ROOT  REPAIR;  Surgeon: Vanetta Mulders, MD;  Location: [facility];  Service: Orthopedics;  Laterality: Right;   Patient Active Problem List   Diagnosis Date Noted   Tear of medial meniscus of right knee, current 02/21/2023   Snoring 08/16/2022   Insomnia 08/16/2022   Hypercholesterolemia 10/31/2012   Vitamin D deficiency 10/30/2012   ALLERGIC RHINITIS 04/10/2009     REFERRING PROVIDER: Vanetta Mulders, MD  REFERRING DIAG: [phone] (ICD-10-CM) - Acute medial meniscus tear of right knee, initial encounter  THERAPY DIAG:  Right knee pain, unspecified chronicity  Stiffness of right knee, not elsewhere classified  Muscle weakness (generalized)  Difficulty in walking, not elsewhere classified  Rationale for  Evaluation and Treatment: Rehabilitation  ONSET DATE: DOS 02/21/2023  SUBJECTIVE:   SUBJECTIVE STATEMENT: Pt states her pain began in October.  She had no specific MOI.  Pt began having pain with increasing her exercise activity including zumba and increasing her walking.  Pt saw Dr. Georgina Snell who ordered an x ray and MRI.  MRI confirmed medial meniscus tear and MD referred pt to orthopedics.      Pt underwent R knee medial meniscal root repair on 3/26.  Post op plan on Op note indicated Pt to be TDWB'ing for a total of 2 weeks with her brace locked in extension and to begin PT.  Pt didn't take any oxycodone and has been using tylenol and Ibuprofen which was prescribed.  She is decreasing her meds.    Pt is limited with all of her normal functional mobility skills including ambulation, transfers, and stairs.  She has TDWB'ing restrictions and is using a FWW.  Pt is unable to perform her normal household chores and walking program.  She has not been working, but may start today.  She is unable to dance.   PERTINENT HISTORY: R knee medial meniscal root repair on 02/21/23.  TDWB'ing for 2 weeks with brace locked in extension. X ray findings of early osteoarthritis and MRI findings of tricompartmental deg changes most significant at the patellofemoral joint.  PAIN:  Are you having pain? Yes NPRS:  2-3/10 current, 5-6/10 worst, 0/10 best  PRECAUTIONS:  Other: per surgical protocol and x-ray/MRI findings  WEIGHT BEARING RESTRICTIONS: Yes TDWB'ing with brace locked in extension  FALLS:  Has patient fallen in last 6 months? 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   PLOF: Independent.  Pt was able to perform her ADLs/IADLs and functional mobility skills independently.  Pt ambulated without an AD.  She was able to dance and perform her  walking program.   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 MRI findings indicated tricompartmental degenerative changes most significant at the patellofemoral joint.  PATIENT SURVEYS:  FOTO Will give next visit  COGNITION: Overall cognitive status: Within functional limits for tasks assessed     OBSERVATION: Pt L knee in brace locked in extension.  She has an ACE bandage over knee and dressings over portals.  PT removed the old dressings.  Pt's portals/incisions are intact with stitches.  She has some expected dried blood on bandages though no excessive drainage.  She has no signs of infection.  PT applied new gauze and tegaderm over portals and reapplied ACE bandage.     LOWER EXTREMITY ROM:  Active ROM Right eval Left eval  Hip flexion    Hip extension    Hip abduction    Hip adduction    Hip internal rotation    Hip external rotation    Knee flexion  143  Knee extension WFL 8 deg of hyperextension  Ankle dorsiflexion    Ankle plantarflexion    Ankle inversion    Ankle eversion     (Blank rows = not tested)  LOWER EXTREMITY MMT:  Strength not formally assessed due to healing constraints and surgical protocol.  Pt has a Fair (+) quad set.   GAIT: Comments: Pt ambulated with a FWW with instruction for TDWB'ing restrictions.  Pt able to perform TDWB'ing with gait correctly and required cuing to not let foot get too close to the front of the walker.  Pt had no pain with TDWB'ing with gait.     TODAY'S TREATMENT:                                                                                                                                Gait training with TDWB'ing restrictions.  PT educated pt and demonstrated TDWB'ing restrictions with gait. Pt performed quad  sets with 5 sec hold x 10 reps and ankle pumps x 20 reps.  Pt received a HEP handout and was educated in correct form and appropriate frequency.   PATIENT EDUCATION:  Education details:  Educated pt on dressings.  Proper brace fit.  TDWB'ing restrictions,  gait.  Post op and protocol restrictions and MD orders.  Dx, relevant anatomy, HEP, and POC.  PT answered pt's and husband's questions.  Person educated: Patient and Spouse Education method: Explanation, Demonstration, Tactile cues, Verbal cues, and Handouts Education comprehension: verbalized understanding, returned demonstration, verbal cues required, tactile cues required, and needs further education  HOME EXERCISE PROGRAM: Access Code: [email or web address] URL: [email or web address] Date: 02/27/2023 Prepared by: Ronny Flurry  Exercises - Supine Quadricep Sets  - 2 x daily - 7 x weekly - 2 sets - 10 reps - 5 seconds hold - ANKLE PUMPS 20-30 every hour  ASSESSMENT:  CLINICAL IMPRESSION: Patient is a 52 y.o. female 6 days s/p R knee medial meniscal root repair presenting to the clinic with expected post op findings of R knee pain, limited knee ROM, muscle weakness in R LE, and difficulty in walking.  Pt is limited with all of her normal functional mobility skills including ambulation, transfers, and stairs.  She has TDWB'ing restrictions and is using a FWW.  Pt is unable to perform her normal household chores and walking program.  She should benefit from skilled PT services per protocol to address impairments and to assist in restoring PLOF.       OBJECTIVE IMPAIRMENTS: Abnormal gait, decreased activity tolerance, decreased endurance, decreased mobility, difficulty walking, decreased ROM, decreased strength, hypomobility, increased edema, impaired flexibility, and pain.   ACTIVITY LIMITATIONS: bending, standing, squatting, stairs, transfers, and bed mobility  PARTICIPATION LIMITATIONS: meal prep, cleaning, laundry, driving,  shopping, and community activity  PERSONAL FACTORS:     REHAB POTENTIAL: Good  CLINICAL DECISION MAKING: Stable/uncomplicated  EVALUATION COMPLEXITY: Low   GOALS:   SHORT TERM GOALS: Target date: 03/27/2023  Pt will be independent and compliant with HEP for improved pain, ROM, strength, and function.    2.   Pt will progress with [place] per MD orders without adverse effects for improved mobility.  Baseline:  Goal status: INITIAL  3.  Pt will demo L knee flexion PROM to be 90 deg for improved stiffness and mobility.  Baseline:  Goal status: INITIAL  4.  Pt will demo a good quad set and perform a supine SLR independently without significant extensor lag for improved strength.  Baseline:  Goal status: INITIAL  5.  Pt will be able to perform a 6 inch step up with good form and control.  Baseline:  Goal status: INITIAL Target date:  05/08/2023   6.  Pt will ambulate with a normalized heel to toe gait pattern without AD.  Baseline:  Goal status: INITIAL Target date:  05/22/2023  7.  Pt will demo L knee AROM to 0 - 115 deg for improved mobility and stiffness.  Baseline:  Goal status: INITIAL Target date:  05/22/2023  8.  Pt will score 0-1 on the lateral step down test on a 4 inch step for improved quad eccentric control and performance of stairs.  Goal Status:  INITIAL  Target date:  05/29/2023   LONG TERM GOALS: Target date: 06/19/2023   Pt will ambulate extended community distance without difficulty and significant pain.    2. Pt will be able to perform stairs with a reciprocal gait with the rail.   Baseline:  Goal status: INITIAL  3.  Pt will be able to perform her ADLs and IADLs including household chores without significant pain and difficulty.  Baseline:  Goal status: INITIAL  4.   Pt will demo 5/5 strength in L hip flex and abd and at least  4+/5 strength in L knee flex and extension for improved performance of and tolerance with functional mobility.   Baseline:  Goal status: INITIAL      PLAN:  PT FREQUENCY: 1-2x/week  PT DURATION: other: 16 weeks  PLANNED INTERVENTIONS: Therapeutic exercises, Therapeutic activity, Neuromuscular re-education, Balance training, Gait training, Patient/Family education, Self Care, Joint mobilization, Stair training, DME instructions, Aquatic Therapy, Dry Needling, Electrical stimulation, Cryotherapy, Moist heat, scar mobilization, Taping, Ultrasound, Manual therapy, and Re-evaluation  PLAN FOR NEXT SESSION: Review and perform HEP.  Assess knee ROM and performance of SLR.  Give FOTO.     Selinda Michaels III PT, DPT 02/28/23 11:04 AM

## 2023-02-27 ENCOUNTER — Ambulatory Visit (HOSPITAL_BASED_OUTPATIENT_CLINIC_OR_DEPARTMENT_OTHER): Payer: 59 | Attending: Orthopaedic Surgery | Admitting: Physical Therapy

## 2023-02-27 ENCOUNTER — Encounter (HOSPITAL_BASED_OUTPATIENT_CLINIC_OR_DEPARTMENT_OTHER): Payer: Self-pay | Admitting: Physical Therapy

## 2023-02-27 DIAGNOSIS — M25561 Pain in right knee: Secondary | ICD-10-CM | POA: Diagnosis present

## 2023-02-27 DIAGNOSIS — M6281 Muscle weakness (generalized): Secondary | ICD-10-CM | POA: Diagnosis present

## 2023-02-27 DIAGNOSIS — M25661 Stiffness of right knee, not elsewhere classified: Secondary | ICD-10-CM | POA: Diagnosis present

## 2023-02-27 DIAGNOSIS — S83241A Other tear of medial meniscus, current injury, right knee, initial encounter: Secondary | ICD-10-CM | POA: Insufficient documentation

## 2023-02-27 DIAGNOSIS — R262 Difficulty in walking, not elsewhere classified: Secondary | ICD-10-CM

## 2023-02-28 ENCOUNTER — Other Ambulatory Visit: Payer: Self-pay

## 2023-03-03 ENCOUNTER — Ambulatory Visit (HOSPITAL_BASED_OUTPATIENT_CLINIC_OR_DEPARTMENT_OTHER): Payer: 59 | Admitting: Physical Therapy

## 2023-03-03 ENCOUNTER — Encounter (HOSPITAL_BASED_OUTPATIENT_CLINIC_OR_DEPARTMENT_OTHER): Payer: Self-pay | Admitting: Physical Therapy

## 2023-03-03 DIAGNOSIS — M6281 Muscle weakness (generalized): Secondary | ICD-10-CM

## 2023-03-03 DIAGNOSIS — M25661 Stiffness of right knee, not elsewhere classified: Secondary | ICD-10-CM

## 2023-03-03 DIAGNOSIS — M25561 Pain in right knee: Secondary | ICD-10-CM | POA: Diagnosis not present

## 2023-03-03 DIAGNOSIS — R262 Difficulty in walking, not elsewhere classified: Secondary | ICD-10-CM

## 2023-03-03 NOTE — Therapy (Signed)
OUTPATIENT PHYSICAL THERAPY TREATMENT NOTE   Patient Name: Katie Beck MRN: [id number] DOB:[birth year], 52 y.o., female Today's Date: 03/03/2023  END OF SESSION:  PT End of Session - 03/03/23 0949     Visit Number 2    Number of Visits 29    Date for PT Re-Evaluation 05/22/23    Authorization Type [employer]    PT Start Time [phone]    PT Stop Time 1023    PT Time Calculation (min) 42 min    Activity Tolerance Patient tolerated treatment well    Behavior During Therapy [facility] for tasks assessed/performed             Past Medical History:  Diagnosis Date   Anemia 10/30/2012   Complication of anesthesia    Reaction to Septocaine w/ epi during dental procedure in office- lightheaded, palpitations/anxiety   CTS (carpal tunnel syndrome)    Medial meniscus tear    root tear 09/2022   PCO (polycystic ovaries)    conceived on metformin   Vestibular neuronitis 07/27/2014   ED visit for vertigo: CT angio neck normal   Vitamin D deficiency 10/30/2012   Past Surgical History:  Procedure Laterality Date   CESAREAN SECTION  2009 &2002   KNEE ARTHROSCOPY WITH MENISCAL REPAIR Right 02/21/2023   Procedure: RIGHT KNEE ARTHROSCOPY WITH MEDIAL MENISCAL ROOT  REPAIR;  Surgeon: Huel Cote, MD;  Location: [facility];  Service: Orthopedics;  Laterality: Right;   Patient Active Problem List   Diagnosis Date Noted   Tear of medial meniscus of right knee, current 02/21/2023   Snoring 08/16/2022   Insomnia 08/16/2022   Hypercholesterolemia 10/31/2012   Vitamin D deficiency 10/30/2012   ALLERGIC RHINITIS 04/10/2009     REFERRING PROVIDER: Huel Cote, MD  REFERRING DIAG: [phone] (ICD-10-CM) - Acute medial meniscus tear of right knee, initial encounter  THERAPY DIAG:  Right knee pain, unspecified chronicity  Stiffness of right knee, not elsewhere classified  Muscle weakness (generalized)  Difficulty in walking, not elsewhere classified  Rationale for Evaluation  and Treatment: Rehabilitation  ONSET DATE: DOS 02/21/2023  SUBJECTIVE:   SUBJECTIVE STATEMENT:   Pt is 1 week and 3 days s/p R knee medial meniscal root repair.  Pt is able to perform gait with TDWB'ing restrictions without increased pain.  Pt reports compliance with HEP.  Pt has stopped taking Ibuprofen.  Pt is limited with all of her normal functional mobility skills including ambulation, transfers, and stairs.  She has TDWB'ing restrictions and is using a FWW.  Pt is unable to perform her normal household chores and walking program.  She is unable to dance.     PERTINENT HISTORY: R knee medial meniscal root repair on 02/21/23.  TDWB'ing for 2 weeks with brace locked in extension. X ray findings of early osteoarthritis and MRI findings of tricompartmental deg changes most significant at the patellofemoral joint.  PAIN:  Are you having pain? Yes NPRS: 1-2/10 current, 5-6/10 worst, 0/10 best  PRECAUTIONS: Other: per surgical protocol and x-ray/MRI findings  WEIGHT BEARING RESTRICTIONS: Yes TDWB'ing with brace locked in extension  FALLS:  Has patient fallen in last 6 months? No  LIVING ENVIRONMENT: Lives with: lives with their spouse Lives in: 2 story home Stairs: yes Has following equipment at home: Dan Humphreys - 2 wheeled and Crutches  OCCUPATION: Pt is an Scientist, product/process development.   PLOF: Independent.  Pt was able to perform her ADLs/IADLs and functional mobility skills independently.  Pt ambulated without an AD.  She was  able to dance and perform her walking program.   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 MRI findings indicated tricompartmental degenerative changes most significant at the patellofemoral joint.  TODAY'S TREATMENT:     PATIENT SURVEYS:  FOTO 22 with a goal  of 59 at visit 17     OBSERVATION: L knee in brace locked in extension.  She has an ACE bandage over knee and dressings over portals.  PT removed the gauze and tegaderm and replaced it with new gauze and tegaderm over the portals/incision.  Pt's portals/incisions are intact with stitches.  Portals/incisions are clean and dry without any signs of infection.  2 portals have xerform gauze over them.     LOWER EXTREMITY ROM:  Active ROM Right eval Left eval Right 4/5  Hip flexion     Hip extension     Hip abduction     Hip adduction     Hip internal rotation     Hip external rotation     Knee flexion  143 AAROM 44 - 48 deg  Knee extension WFL 8 deg of hyperextension 0 deg  Ankle dorsiflexion     Ankle plantarflexion     Ankle inversion     Ankle eversion      (Blank rows = not tested)    Reviewed pt presentation, pain level, response to prior Rx, and HEP compliance. PT assessed ROM.   Pt performed: quad sets with 5 sec hold 2 x 10 reps ankle pumps x 20 reps Longsitting knee flexion PROM/AAROM with hands under thigh 2x10 Supine SLR 2x10 reps  PT reviewed HEP and updated HEP.  Pt received a HEP handout and was educated in correct form and appropriate frequency.  PT educated pt in appropriate ROM with knee flexion AAROM with hands under thighs.  PT instructed pt to not perform into a tight or painful range.  PT instructed pt in not going past 90 deg per protocol.  PT showed pt what 90 deg is with other knee.  PT instructed pt she should not have pain with HEP.   PATIENT EDUCATION:  Education details:  Educated pt on dressings.  Proper brace fit.  TDWB'ing restrictions, gait.  Post op and protocol restrictions and MD orders.  Dx, relevant anatomy, HEP, and POC.  PT answered pt's and husband's questions.  Person educated: Patient and Spouse Education method: Explanation, Demonstration, Tactile cues, Verbal cues, and Handouts Education comprehension: verbalized understanding,  returned demonstration, verbal cues required, tactile cues required, and needs further education  HOME EXERCISE PROGRAM: Access Code: [email or web address] URL: [email or web address] Date: 02/27/2023 Prepared by: Aaron Edelmanrey Jamin Panther  Exercises - Supine Quadricep Sets  - 2 x daily - 7 x weekly - 2 sets - 10 reps - 5 seconds hold - ANKLE PUMPS 20-30 every hour  Updated HEP: - Sitting knee flexion PROM with hands under thigh  - 2 x daily - 7 x weekly - 2 sets - 10 reps - Supine Active Straight Leg Raise  - 1 x daily - 7 x weekly - 2 sets - 10 reps  ASSESSMENT:  CLINICAL IMPRESSION: Patient is doing well at this time in protocol.  She has no pain ambulating with TDWB'ing restrictions with FWW.  Pt has good quad activation at this time in protocol as evidenced by quad set  and SLR.  Pt is able to perform a supine SLR well independently without pain.  Pt has 0 deg of extension.  PT changed dressings today and portals/incisions look good having no signs of infection.  Pt performed exercises per protocol well without c/o's.  PT added knee flexion ROM and supine SLR per protocol to HEP.  PT educated pt in appropriate ROM and correct form and Pt demonstrates good understanding.  Pt responded well to Rx having no increased pain after Rx.  She should benefit from cont skilled PT services per protocol to address impairments and goals and to assist in restoring PLOF.       OBJECTIVE IMPAIRMENTS: Abnormal gait, decreased activity tolerance, decreased endurance, decreased mobility, difficulty walking, decreased ROM, decreased strength, hypomobility, increased edema, impaired flexibility, and pain.   ACTIVITY LIMITATIONS: bending, standing, squatting, stairs, transfers, and bed mobility  PARTICIPATION LIMITATIONS: meal prep, cleaning, laundry, driving, shopping, and community activity  PERSONAL FACTORS:     REHAB POTENTIAL: Good  CLINICAL DECISION MAKING: Stable/uncomplicated  EVALUATION COMPLEXITY:  Low   GOALS:   SHORT TERM GOALS: Target date: 03/27/2023  Pt will be independent and compliant with HEP for improved pain, ROM, strength, and function.    2.   Pt will progress with Wb'ing per MD orders without adverse effects for improved mobility.  Baseline:  Goal status: INITIAL  3.  Pt will demo L knee flexion PROM to be 90 deg for improved stiffness and mobility.  Baseline:  Goal status: INITIAL  4.  Pt will demo a good quad set and perform a supine SLR independently without significant extensor lag for improved strength.  Baseline:  Goal status: INITIAL  5.  Pt will be able to perform a 6 inch step up with good form and control.  Baseline:  Goal status: INITIAL Target date:  05/08/2023   6.  Pt will ambulate with a normalized heel to toe gait pattern without AD.  Baseline:  Goal status: INITIAL Target date:  05/22/2023  7.  Pt will demo L knee AROM to 0 - 115 deg for improved mobility and stiffness.  Baseline:  Goal status: INITIAL Target date:  05/22/2023  8.  Pt will score 0-1 on the lateral step down test on a 4 inch step for improved quad eccentric control and performance of stairs.  Goal Status:  INITIAL  Target date:  05/29/2023   LONG TERM GOALS: Target date: 06/19/2023   Pt will ambulate extended community distance without difficulty and significant pain.    2. Pt will be able to perform stairs with a reciprocal gait with the rail.   Baseline:  Goal status: INITIAL  3.  Pt will be able to perform her ADLs and IADLs including household chores without significant pain and difficulty.  Baseline:  Goal status: INITIAL  4.   Pt will demo 5/5 strength in L hip flex and abd and at least 4+/5 strength in L knee flex and extension for improved performance of and tolerance with functional mobility.  Baseline:  Goal status: INITIAL      PLAN:  PT FREQUENCY: 1-2x/week  PT DURATION: other: 16 weeks  PLANNED INTERVENTIONS: Therapeutic exercises,  Therapeutic activity, Neuromuscular re-education, Balance training, Gait training, Patient/Family education, Self Care, Joint mobilization, Stair training, DME instructions, Aquatic Therapy, Dry Needling, Electrical stimulation, Cryotherapy, Moist heat, scar mobilization, Taping, Ultrasound, Manual therapy, and Re-evaluation  PLAN FOR NEXT SESSION: Cont per Dr. Serena CroissantBokshan's meniscus repair protocol.  Review and perform HEP.  Pt  received message from MD concerning TDWB'ing restrictions.     Audie Clear III PT, DPT 03/03/23 3:51 PM

## 2023-03-08 ENCOUNTER — Encounter (HOSPITAL_BASED_OUTPATIENT_CLINIC_OR_DEPARTMENT_OTHER): Payer: 59 | Admitting: Physical Therapy

## 2023-03-08 ENCOUNTER — Ambulatory Visit (INDEPENDENT_AMBULATORY_CARE_PROVIDER_SITE_OTHER): Payer: 59 | Admitting: Orthopaedic Surgery

## 2023-03-08 DIAGNOSIS — S83241A Other tear of medial meniscus, current injury, right knee, initial encounter: Secondary | ICD-10-CM

## 2023-03-08 NOTE — Progress Notes (Signed)
Post Operative Evaluation    Procedure/Date of Surgery: Right knee arthroscopy with medial meniscal repair 3/26  Interval History:    Presents today 2 weeks status post right medial meniscal root repair overall doing extremely well.  She is having some soreness about the medial aspect of the knee although this is mild.  She was able to put some additional weight on this today which felt good.  There is some swelling.  This is overall improving.  She is working physical therapy.  She has been using a walker.  She is compliant with aspirin usage   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   ED visit for vertigo: CT angio neck normal   Vitamin D deficiency 10/30/2012   Past Surgical History:  Procedure Laterality Date   CESAREAN SECTION  2009 &2002   KNEE ARTHROSCOPY WITH MENISCAL REPAIR Right 02/21/2023   Procedure: RIGHT KNEE ARTHROSCOPY WITH MEDIAL MENISCAL ROOT  REPAIR;  Surgeon: Huel Cote, MD;  Location: [facility];  Service: Orthopedics;  Laterality: Right;   Social History   Socioeconomic History   Marital status: Married    Spouse name: Not on file   Number of children: Not on file   Years of education: Not on file   Highest education level: Not on file  Occupational History   Occupation: IT  Tobacco Use   Smoking status: Never   Smokeless tobacco: Never  Vaping Use   Vaping Use: Never used  Substance and Sexual Activity   Alcohol use: Yes    Comment: occasional   Drug use: No   Sexual activity: Yes    Partners: Male    Birth  control/protection: None  Other Topics Concern   Not on file  Social History Narrative   [place] country of origin        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         pets no        NO ets or tobacco.         Vegetarian      Smoking Status:  never      Caffeine use/day:  3-4      Does Patient Exercise:  no         Social Determinants of Health   Financial Resource Strain: Not on file  Food Insecurity: Not on file  Transportation Needs: Not on file  Physical Activity: Not on file  Stress: Not on file  Social Connections: Not on file   Family History  Problem Relation Age of Onset   Diabetes Father    Hypertension Father    Hypertension Sister    Diabetes Sister    Hypertension Mother    Diabetes Mother    Hyperlipidemia Mother    Thyroid disease Mother    Diabetes Maternal Actor  Pancreatic cancer Brother        pancreatic   Allergies  Allergen Reactions   Other Anxiety, Palpitations and Other (See Comments)    SEPTOCAINE w/ EPI  Used during a dental procedure- caused dizziness/ lightheaded/ heart pounding and anxiety   Current Outpatient Medications  Medication Sig Dispense Refill   aspirin EC 325 MG tablet Take 1 tablet (325 mg total) by mouth daily. 30 tablet 0   calcium carbonate (OS-CAL) 600 MG TABS tablet Take 1 tablet (600 mg total) by mouth 2 (two) times daily with a meal. 60 tablet 12   cetirizine (ZYRTEC) 10 MG tablet Take 10 mg by mouth daily as needed for allergies.     Multiple Vitamin (MULTIVITAMIN) tablet Take 1 tablet by mouth daily.     oxyCODONE (ROXICODONE) 5 MG immediate release tablet Take 1 tablet (5 mg total) by mouth every 4 (four) hours as needed for severe pain or breakthrough pain. (Patient not taking: Reported on 02/27/2023) 10 tablet 0   Vitamin D, Ergocalciferol, (DRISDOL) 1.25 MG (50000 UNIT) CAPS capsule TAKE 1 CAPSULE (50,000 UNITS TOTAL) BY MOUTH EVERY 7 (SEVEN)  DAYS 4 capsule 0   vitamin E 180 MG (400 UNITS) capsule Take 1 capsule (400 Units total) by mouth 2 (two) times daily with a meal. 60 capsule 12   No current facility-administered medications for this visit.   No results found.  Review of Systems:   A ROS was performed including pertinent positives and negatives as documented in the HPI.   Musculoskeletal Exam:    Right knee incision is well-appearing without erythema or drainage.  Range of motion is from 0 to 80 degrees without pain.  Distal neurosensory exam intact  Imaging:      I personally reviewed and interpreted the radiographs.   Assessment:   2 weeks status post right knee medial meniscal repair overall doing very well.  At this time she will advance her weightbearing.  I would like her to use her brace for an additional 2 weeks.  She may be out of this for sleeping and hygiene.  I will plan to see her back in 4 weeks for reassessment.  She will advance her weightbearing at this time.  Plan :    -Return to clinic in 4 weeks for reassessment      I personally saw and evaluated the patient, and participated in the management and treatment plan.  Huel Cote, MD Attending Physician, Orthopedic Surgery  This document was dictated using Dragon voice recognition software. A reasonable attempt at proof reading has been made to minimize errors.

## 2023-03-15 ENCOUNTER — Ambulatory Visit (HOSPITAL_BASED_OUTPATIENT_CLINIC_OR_DEPARTMENT_OTHER): Payer: 59 | Admitting: Physical Therapy

## 2023-03-15 DIAGNOSIS — M25661 Stiffness of right knee, not elsewhere classified: Secondary | ICD-10-CM

## 2023-03-15 DIAGNOSIS — M6281 Muscle weakness (generalized): Secondary | ICD-10-CM

## 2023-03-15 DIAGNOSIS — M25561 Pain in right knee: Secondary | ICD-10-CM | POA: Diagnosis not present

## 2023-03-15 DIAGNOSIS — R262 Difficulty in walking, not elsewhere classified: Secondary | ICD-10-CM

## 2023-03-15 NOTE — Therapy (Signed)
OUTPATIENT PHYSICAL THERAPY TREATMENT NOTE   Patient Name: Katie Beck MRN: [id number] DOB:09-07-1971, 52 y.o., female Today's Date: 03/16/2023  END OF SESSION:  PT End of Session - 03/15/23 0910     Visit Number 3    Number of Visits 29    Date for PT Re-Evaluation 05/22/23    Authorization Type U[employer]    PT Start Time 0810    PT Stop Time 0852    PT Time Calculation (min) 42 min    Activity Tolerance Patient tolerated treatment well    Behavior During Therapy [facility] for tasks assessed/performed              Past Medical History:  Diagnosis Date   Anemia 10/30/2012   Complication of anesthesia    Reaction to Septocaine w/ epi during dental procedure in office- lightheaded, palpitations/anxiety   CTS (carpal tunnel syndrome)    Medial meniscus tear    root tear 09/2022   PCO (polycystic ovaries)    conceived on metformin   Vestibular neuronitis 07/27/2014   ED visit for vertigo: CT angio neck normal   Vitamin D deficiency 10/30/2012   Past Surgical History:  Procedure Laterality Date   CESAREAN SECTION  2009 &2002   KNEE ARTHROSCOPY WITH MENISCAL REPAIR Right 02/21/2023   Procedure: RIGHT KNEE ARTHROSCOPY WITH MEDIAL MENISCAL ROOT  REPAIR;  Surgeon: Huel Cote, MD;  Location:  [facility];  Service: Orthopedics;  Laterality: Right;   Patient Active Problem List   Diagnosis Date Noted   Tear of medial meniscus of right knee, current 02/21/2023   Snoring 08/16/2022   Insomnia 08/16/2022   Hypercholesterolemia 10/31/2012   Vitamin D deficiency 10/30/2012   ALLERGIC RHINITIS 04/10/2009     REFERRING PROVIDER: Huel Cote, MD  REFERRING DIAG: [phone] (ICD-10-CM) - Acute medial meniscus tear of right knee, initial encounter  THERAPY DIAG:  Right knee pain, unspecified chronicity  Stiffness of right knee, not elsewhere classified  Muscle weakness (generalized)  Difficulty in walking, not elsewhere classified  Rationale for Evaluation  and Treatment: Rehabilitation  ONSET DATE: DOS 02/21/2023  SUBJECTIVE:   SUBJECTIVE STATEMENT:   Pt is 3 weeks and 1 day s/p R knee medial meniscal root repair.  Pt saw MD on 03/08/23 and he was pleased with progress.  Pt states she has to wear the brace for 2 more weeks from last Wednesday.  Pt states she can take off the brace when she is sitting and sleeping and is allowed to increase WB'ing.  MD note indicated for pt to advance her Wb'ing, to use her brace for an additional 2 weeks, and she can be out of her brace for sleeping and hygiene.   Pt reports compliance with HEP.  Pt reports no pain in knee at rest and has a 1-2/10 pain currently.  Pt states she has been having some L UE pain and low back pain.  Pt reports she has put more weight on R LE without increased pain.  Pt denies any adverse effects after prior Rx.        PERTINENT HISTORY: R knee medial meniscal root repair on 02/21/23.  TDWB'ing for 2 weeks with brace locked in extension. X ray findings of early osteoarthritis and MRI findings of tricompartmental deg changes most significant at the patellofemoral joint.  PAIN:  Are you having pain? Yes NPRS: 1-2/10 current, 5-6/10 worst, 0/10 best  PRECAUTIONS: Other: per surgical protocol and x-ray/MRI findings  WEIGHT BEARING RESTRICTIONS: Yes TDWB'ing with brace locked  in extension  FALLS:  Has patient fallen in last 6 months? No  LIVING ENVIRONMENT: Lives with: lives with their spouse Lives in: 2 story home Stairs: yes Has following equipment at home: Dan Humphreys - 2 wheeled and Crutches  OCCUPATION: Pt is an Scientist, product/process development.   PLOF: Independent.  Pt was able to perform her ADLs/IADLs and functional mobility skills independently.  Pt ambulated without an AD.  She was able to dance and perform her walking program.   PATIENT GOALS: wants to be able to perform her walking program, take yoga  NEXT MD VISIT: 03/08/2023  OBJECTIVE:   DIAGNOSTIC FINDINGS: X rays on 1/22:   IMPRESSION: Early osteoarthritis with trace patellofemoral and lateral tibiofemoral spurring. MRI findings indicated tricompartmental degenerative changes most significant at the patellofemoral joint.  TODAY'S TREATMENT:       OBSERVATION: L knee in brace locked in extension.  Portals/incisions are intact, clean and dry without any signs of infection.      Girth Measurements:  R/L:  33.5 / 35.8  LOWER EXTREMITY ROM:  Active ROM Right eval Left eval Right 4/5 Right 4/17  Hip flexion      Hip extension      Hip abduction      Hip adduction      Hip internal rotation      Hip external rotation      Knee flexion  143 AAROM 44 - 48 deg AAROM:  82  Knee extension WFL 8 deg of hyperextension 0 deg 1 deg  Ankle dorsiflexion      Ankle plantarflexion      Ankle inversion      Ankle eversion       (Blank rows = not tested)    Reviewed pt presentation, pain level, response to prior Rx, and HEP compliance. PT assessed ROM.   Pt performed: Supine heel slide with strap 2x10 Supine SLR 2x10 reps PF with GTB 2x10 Longsitting gastroc stretch 3x20-30 sec S/L hip abd 2x10 S/L hip add 2x10  PT reviewed HEP and updated HEP.  Pt received a HEP handout and was educated in correct form and appropriate frequency.  PT educated pt in appropriate ROM and instructed her that she should not have pain with HEP.   PT unlocked brace to 0-30 deg and Pt ambulated with brace at 0-30 deg with FWW well with good stability and without increased pain.  PT educated pt with how to adjust the ROM limits on brace.  PT instructed pt and husband to change back to 0 deg and lock brace if she has pain or instability with ambulation.    PATIENT EDUCATION:  Education details:  Brace, gait, Wb'ing, exercise form, HEP, Post op and protocol restrictions, relevant anatomy, POC, and ROM limits on brace.  PT answered pt's and husband's questions.  Person educated: Patient and Spouse Education method: Explanation,  Demonstration, Tactile cues, Verbal cues, and Handouts Education comprehension: verbalized understanding, returned demonstration, verbal cues required, tactile cues required, and needs further education  HOME EXERCISE PROGRAM: Access Code: [email or web address] URL: [email or web address] Date: 02/27/2023 Prepared by: Aaron Edelman  Updated HEP: - Supine Heel Slide with Strap  - 2 x daily - 7 x weekly - 2 sets - 10 reps - Long Sitting Ankle Plantar Flexion with Resistance  - 1 x daily - 5-6 x weekly - 3 sets - 10 reps - Long Sitting Calf Stretch with Strap  - 2 x daily - 7 x weekly - 2-3 reps - 20-30 seconds hold -  Sidelying Hip Abduction  - 1 x daily - 7 x weekly - 2 sets - 10 reps  ASSESSMENT:  CLINICAL IMPRESSION: Patient is progressing well with ROM, gait, and protocol.  Pt has been increasing her Wb'ing per MD orders and reports no increased pain.  PT progressed exercises per protocol and pt tolerated progression well.  She performed exercises well with cuing and instruction in correct form and positioning without c/o's.  PT updated HEP and gave pt a HEP handout.  Pt and husband demonstrate good understanding of advanced HEP including appropriate knee ROM.  Pt demonstrates improved knee flexion AAROM and slightly worse extension ROM by 1 deg.  PT opened brace to 0-30 deg and pt ambulated in the clinic with good stability without pain with FWW.  She responded well to Rx having no increased pain after Rx.  She should benefit from cont skilled PT services per protocol to address impairments and goals and to assist in restoring PLOF.      OBJECTIVE IMPAIRMENTS: Abnormal gait, decreased activity tolerance, decreased endurance, decreased mobility, difficulty walking, decreased ROM, decreased strength, hypomobility, increased edema, impaired flexibility, and pain.   ACTIVITY LIMITATIONS: bending, standing, squatting, stairs, transfers, and bed mobility  PARTICIPATION LIMITATIONS: meal prep,  cleaning, laundry, driving, shopping, and community activity  PERSONAL FACTORS:     REHAB POTENTIAL: Good  CLINICAL DECISION MAKING: Stable/uncomplicated  EVALUATION COMPLEXITY: Low   GOALS:   SHORT TERM GOALS: Target date: 03/27/2023  Pt will be independent and compliant with HEP for improved pain, ROM, strength, and function.    2.   Pt will progress with Wb'ing per MD orders without adverse effects for improved mobility.  Baseline:  Goal status: INITIAL  3.  Pt will demo L knee flexion PROM to be 90 deg for improved stiffness and mobility.  Baseline:  Goal status: INITIAL  4.  Pt will demo a good quad set and perform a supine SLR independently without significant extensor lag for improved strength.  Baseline:  Goal status: INITIAL  5.  Pt will be able to perform a 6 inch step up with good form and control.  Baseline:  Goal status: INITIAL Target date:  05/08/2023   6.  Pt will ambulate with a normalized heel to toe gait pattern without AD.  Baseline:  Goal status: INITIAL Target date:  05/22/2023  7.  Pt will demo L knee AROM to 0 - 115 deg for improved mobility and stiffness.  Baseline:  Goal status: INITIAL Target date:  05/22/2023  8.  Pt will score 0-1 on the lateral step down test on a 4 inch step for improved quad eccentric control and performance of stairs.  Goal Status:  INITIAL  Target date:  05/29/2023   LONG TERM GOALS: Target date: 06/19/2023   Pt will ambulate extended community distance without difficulty and significant pain.    2. Pt will be able to perform stairs with a reciprocal gait with the rail.   Baseline:  Goal status: INITIAL  3.  Pt will be able to perform her ADLs and IADLs including household chores without significant pain and difficulty.  Baseline:  Goal status: INITIAL  4.   Pt will demo 5/5 strength in L hip flex and abd and at least 4+/5 strength in L knee flex and extension for improved performance of and tolerance with  functional mobility.  Baseline:  Goal status: INITIAL      PLAN:  PT FREQUENCY: 1-2x/week  PT DURATION: other: 16 weeks  PLANNED INTERVENTIONS: Therapeutic exercises, Therapeutic activity, Neuromuscular re-education, Balance training, Gait training, Patient/Family education, Self Care, Joint mobilization, Stair training, DME instructions, Aquatic Therapy, Dry Needling, Electrical stimulation, Cryotherapy, Moist heat, scar mobilization, Taping, Ultrasound, Manual therapy, and Re-evaluation  PLAN FOR NEXT SESSION: Cont per Dr. Serena Croissant meniscus repair protocol.    Audie Clear III PT, DPT 03/16/23 2:27 PM

## 2023-03-16 ENCOUNTER — Encounter (HOSPITAL_BASED_OUTPATIENT_CLINIC_OR_DEPARTMENT_OTHER): Payer: Self-pay | Admitting: Physical Therapy

## 2023-03-22 ENCOUNTER — Ambulatory Visit (HOSPITAL_BASED_OUTPATIENT_CLINIC_OR_DEPARTMENT_OTHER): Payer: 59 | Admitting: Physical Therapy

## 2023-03-22 DIAGNOSIS — M25561 Pain in right knee: Secondary | ICD-10-CM

## 2023-03-22 DIAGNOSIS — M25661 Stiffness of right knee, not elsewhere classified: Secondary | ICD-10-CM

## 2023-03-22 DIAGNOSIS — M6281 Muscle weakness (generalized): Secondary | ICD-10-CM

## 2023-03-22 DIAGNOSIS — R262 Difficulty in walking, not elsewhere classified: Secondary | ICD-10-CM

## 2023-03-22 NOTE — Therapy (Signed)
OUTPATIENT PHYSICAL THERAPY TREATMENT NOTE   Patient Name: Katie Beck MRN: [id number] DOB:[birth year], 52 y.o., female Today's Date: 03/23/2023  END OF SESSION:  PT End of Session - 03/22/23 0930     Visit Number 4    Number of Visits 29    Date for PT Re-Evaluation 05/22/23    Authorization Type UHC    PT Start Time 0810    PT Stop Time 0855    PT Time Calculation (min) 45 min    Activity Tolerance Patient tolerated treatment well    Behavior During Therapy [facility] for tasks assessed/performed               Past Medical History:  Diagnosis Date   Anemia 10/30/2012   Complication of anesthesia    Reaction to Septocaine w/ epi during dental procedure in office- lightheaded, palpitations/anxiety   CTS (carpal tunnel syndrome)    Medial meniscus tear    root tear 09/2022   PCO (polycystic ovaries)    conceived on metformin   Vestibular neuronitis 07/27/2014   ED visit for vertigo: CT angio neck normal   Vitamin D deficiency 10/30/2012   Past Surgical History:  Procedure Laterality Date   CESAREAN SECTION  2009 &2002   KNEE ARTHROSCOPY WITH MENISCAL REPAIR Right 02/21/2023   Procedure: RIGHT KNEE ARTHROSCOPY WITH MEDIAL MENISCAL ROOT  REPAIR;  Surgeon: Huel Cote, MD;  Location: [facility];  Service: Orthopedics;  Laterality: Right;   Patient Active Problem List   Diagnosis Date Noted   Tear of medial meniscus of right knee, current 02/21/2023   Snoring 08/16/2022   Insomnia 08/16/2022   Hypercholesterolemia 10/31/2012   Vitamin D deficiency 10/30/2012   ALLERGIC RHINITIS 04/10/2009     REFERRING PROVIDER: Huel Cote, MD  REFERRING DIAG: [phone] (ICD-10-CM) - Acute medial meniscus tear of right knee, initial encounter  THERAPY DIAG:  Right knee pain, unspecified chronicity  Stiffness of right knee, not elsewhere classified  Muscle weakness (generalized)  Difficulty in walking, not elsewhere classified  Rationale for  Evaluation and Treatment: Rehabilitation  ONSET DATE: DOS 02/21/2023  SUBJECTIVE:   SUBJECTIVE STATEMENT: Pt is 4 weeks and 1 day s/p R knee medial meniscal root repair.  Pt reports she was instructed by MD on 03/08/23 to wear brace for 2 more weeks.  Pt removed brace yesterday and started walking without any AD.  Pt states she began having calf pain yesterday.  She didn't notice any pain with exercises.  She did more walking yesterday without her brace and not sure if that caused her pain.  She was also not using AD with ambulation.  Pt denies any increased knee pain during her ambulation yesterday.      Pt reports compliance with HEP.  Pt denies any adverse effects after prior Rx.  Pt's husbanded moved brace to 50 deg and pt felt fine ambulating with brace at 50 deg      PERTINENT HISTORY: R knee medial meniscal root repair on 02/21/23.  TDWB'ing for 2 weeks with brace locked in extension. X ray findings of early osteoarthritis and MRI findings of tricompartmental deg changes most significant at the patellofemoral joint.  PAIN:  Are you having pain? Yes NPRS: No pain in calf or knee at rest.  4-5/10 pain in R calf with ambulation, 0/10 knee pain with ambulation.   PRECAUTIONS: Other: per surgical protocol and x-ray/MRI findings  WEIGHT BEARING RESTRICTIONS: Yes TDWB'ing with brace locked in extension  FALLS:  Has patient fallen  in last 6 months? No  LIVING ENVIRONMENT: Lives with: lives with their spouse Lives in: 2 story home Stairs: yes Has following equipment at home: Dan Humphreys - 2 wheeled and Crutches  OCCUPATION: Pt is an Scientist, product/process development.   PLOF: Independent.  Pt was able to perform her ADLs/IADLs and functional mobility skills independently.  Pt ambulated without an AD.  She was able to dance and perform her walking program.   PATIENT GOALS: wants to be able to perform her walking program, take yoga  NEXT MD VISIT: 03/08/2023  OBJECTIVE:   DIAGNOSTIC FINDINGS: X rays on  1/22:  IMPRESSION: Early osteoarthritis with trace patellofemoral and lateral tibiofemoral spurring. MRI findings indicated tricompartmental degenerative changes most significant at the patellofemoral joint.  TODAY'S TREATMENT:       LOWER EXTREMITY ROM:  Active ROM Right eval Left eval Right 4/5 Right 4/17 Right 4/24  Hip flexion       Hip extension       Hip abduction       Hip adduction       Hip internal rotation       Hip external rotation       Knee flexion  143 AAROM 44 - 48 deg AAROM:  82 AAROM: 101 deg  Knee extension WFL 8 deg of hyperextension 0 deg 1 deg 0 deg  Ankle dorsiflexion       Ankle plantarflexion       Ankle inversion       Ankle eversion        (Blank rows = not tested)    Reviewed pt presentation, pain level, response to prior Rx, and HEP compliance. PT spent time answering questions concerning calf, walking, AD/brace usage, and HEP.    Pt performed: Supine SLR x10 each with 0#, 1#, and 2# Longsitting gastroc stretch 3x20-30 sec S/L hip abd 2x10 with 2# Pt received R knee flexion and extension PROM in supine.      Gait: PT unlocked brace to 0-70 deg and Pt ambulated with brace at 0-70 deg without AD and with SPC.  Pt had decreased knee flexion and toe off which improved with cuing and also using SPC. PT demonstrated and instructed pt in using a SPC with gait educating her with gait sequencing.  Pt has good stability with brace at 0-70 deg and no knee pain.  PT educated pt with how to adjust the ROM limits on brace.  PT instructed pt and husband to change back to 50 deg if she has pain or instability with ambulation.    PT assessed R calf:   -Pt had tenderness t/o R medial calf and central calf.   -Negative homan's test on R  PATIENT EDUCATION:  Education details:  PT instructed pt to hold on performing resisted PF currently due to calf pain.  Gait training.  Rationale and purpose of using brace and AD including improving quality of gait and  reducing limp.  Instructed pt she should not have increased pain with gait.  Educated pt in HEP, objective findings, post op and protocol restrictions, relevant anatomy, POC, and ROM limits on brace.  PT answered pt's and husband's questions.  Person educated: Patient and Spouse Education method: Explanation, Demonstration, Tactile cues, Verbal cues, and Handouts Education comprehension: verbalized understanding, returned demonstration, verbal cues required, tactile cues required, and needs further education  HOME EXERCISE PROGRAM: Access Code: [email or web address] URL: [email or web address] Date: 02/27/2023 Prepared by: Aaron Edelman  Updated HEP: - Supine Heel Slide with Strap  -  2 x daily - 7 x weekly - 2 sets - 10 reps - Long Sitting Ankle Plantar Flexion with Resistance  - 1 x daily - 5-6 x weekly - 3 sets - 10 reps - Long Sitting Calf Stretch with Strap  - 2 x daily - 7 x weekly - 2-3 reps - 20-30 seconds hold - Sidelying Hip Abduction  - 1 x daily - 7 x weekly - 2 sets - 10 reps  ASSESSMENT:  CLINICAL IMPRESSION:  Pt presents to Rx reporting increased calf pain though no knee pain.  Pt had negative homan's test and her calf is not red or inflamed.  She may be having increased calf pain due to removing brace and ambulating without AD yesterday.  Pt had no calf pain with exercises.  Pt was ambulating with a limp and PT worked on gait.  PT had pt ambulate with brace at 0-70 deg and with and without cane.  Pt has good stability with brace at 0-70 deg and no knee pain.  She also had good stability without brace.  PT instructed pt in using a cane and provided cuing for heel to toe gait.  Pt improved with gait with cuing/instruction and increased repetitions.  PT educated pt concerning using the brace and AD and explained to pt the rationale for using both or one or the other.  Pt had reduced limp and improved gait with brace and cane.  Pt performed exercises well though did require cuing for  correct form with gastroc stretch in longsitting.  Pt is making good progress with knee ROM.  She has full knee extension ROM and demonstrated a good improvement in knee flexion AAROM as evidenced by goniometric measurements.  Pt responded well to Rx having no pain in knee or calf after Rx.  She should benefit from cont skilled PT services per protocol to address impairments and goals and to assist in restoring PLOF.         OBJECTIVE IMPAIRMENTS: Abnormal gait, decreased activity tolerance, decreased endurance, decreased mobility, difficulty walking, decreased ROM, decreased strength, hypomobility, increased edema, impaired flexibility, and pain.   ACTIVITY LIMITATIONS: bending, standing, squatting, stairs, transfers, and bed mobility  PARTICIPATION LIMITATIONS: meal prep, cleaning, laundry, driving, shopping, and community activity  PERSONAL FACTORS:     REHAB POTENTIAL: Good  CLINICAL DECISION MAKING: Stable/uncomplicated  EVALUATION COMPLEXITY: Low   GOALS:   SHORT TERM GOALS: Target date: 03/27/2023  Pt will be independent and compliant with HEP for improved pain, ROM, strength, and function.    2.   Pt will progress with Wb'ing per MD orders without adverse effects for improved mobility.  Baseline:  Goal status: INITIAL  3.  Pt will demo L knee flexion PROM to be 90 deg for improved stiffness and mobility.  Baseline:  Goal status: INITIAL  4.  Pt will demo a good quad set and perform a supine SLR independently without significant extensor lag for improved strength.  Baseline:  Goal status: INITIAL  5.  Pt will be able to perform a 6 inch step up with good form and control.  Baseline:  Goal status: INITIAL Target date:  05/08/2023   6.  Pt will ambulate with a normalized heel to toe gait pattern without AD.  Baseline:  Goal status: INITIAL Target date:  05/22/2023  7.  Pt will demo L knee AROM to 0 - 115 deg for improved mobility and stiffness.  Baseline:  Goal  status: INITIAL Target date:  05/22/2023  8.  Pt will score 0-1 on the lateral step down test on a 4 inch step for improved quad eccentric control and performance of stairs.  Goal Status:  INITIAL  Target date:  05/29/2023   LONG TERM GOALS: Target date: 06/19/2023   Pt will ambulate extended community distance without difficulty and significant pain.    2. Pt will be able to perform stairs with a reciprocal gait with the rail.   Baseline:  Goal status: INITIAL  3.  Pt will be able to perform her ADLs and IADLs including household chores without significant pain and difficulty.  Baseline:  Goal status: INITIAL  4.   Pt will demo 5/5 strength in L hip flex and abd and at least 4+/5 strength in L knee flex and extension for improved performance of and tolerance with functional mobility.  Baseline:  Goal status: INITIAL      PLAN:  PT FREQUENCY: 1-2x/week  PT DURATION: other: 16 weeks  PLANNED INTERVENTIONS: Therapeutic exercises, Therapeutic activity, Neuromuscular re-education, Balance training, Gait training, Patient/Family education, Self Care, Joint mobilization, Stair training, DME instructions, Aquatic Therapy, Dry Needling, Electrical stimulation, Cryotherapy, Moist heat, scar mobilization, Taping, Ultrasound, Manual therapy, and Re-evaluation  PLAN FOR NEXT SESSION: Cont per Dr. Serena Croissant meniscus repair protocol.    Audie Clear III PT, DPT 03/23/23 2:43 PM

## 2023-03-23 ENCOUNTER — Encounter (HOSPITAL_BASED_OUTPATIENT_CLINIC_OR_DEPARTMENT_OTHER): Payer: Self-pay | Admitting: Physical Therapy

## 2023-03-28 ENCOUNTER — Encounter (HOSPITAL_BASED_OUTPATIENT_CLINIC_OR_DEPARTMENT_OTHER): Payer: Self-pay | Admitting: Physical Therapy

## 2023-03-28 ENCOUNTER — Ambulatory Visit (HOSPITAL_BASED_OUTPATIENT_CLINIC_OR_DEPARTMENT_OTHER): Payer: 59 | Admitting: Physical Therapy

## 2023-03-28 DIAGNOSIS — M25561 Pain in right knee: Secondary | ICD-10-CM | POA: Diagnosis not present

## 2023-03-28 DIAGNOSIS — M25661 Stiffness of right knee, not elsewhere classified: Secondary | ICD-10-CM

## 2023-03-28 DIAGNOSIS — R262 Difficulty in walking, not elsewhere classified: Secondary | ICD-10-CM

## 2023-03-28 DIAGNOSIS — M6281 Muscle weakness (generalized): Secondary | ICD-10-CM

## 2023-03-28 NOTE — Therapy (Signed)
OUTPATIENT PHYSICAL THERAPY TREATMENT NOTE   Patient Name: Katie Beck MRN: [id number] DOB:06/04/1971, 52 y.o., female Today's Date: 03/28/2023  END OF SESSION:  PT End of Session - 03/28/23 0819     Visit Number 5    Number of Visits 29    Date for PT Re-Evaluation 05/22/23    Authorization Type [employer]    PT Start Time 0816   Pt arrived 16 mins late to Rx.   PT Stop Time [phone]    PT Time Calculation (min) 36 min    Activity Tolerance Patient tolerated treatment well    Behavior During Therapy [facility] for tasks assessed/performed                Past Medical History:  Diagnosis Date   Anemia 10/30/2012   Complication of anesthesia    Reaction to Septocaine w/ epi during dental procedure in office- lightheaded, palpitations/anxiety   CTS (carpal tunnel syndrome)    Medial meniscus tear    root tear 09/2022   PCO (polycystic ovaries)    conceived on metformin   Vestibular neuronitis 07/27/2014   ED visit for vertigo: CT angio neck normal   Vitamin D deficiency 10/30/2012   Past Surgical History:  Procedure Laterality Date   CESAREAN SECTION  2009 &2002   KNEE ARTHROSCOPY WITH MENISCAL REPAIR Right 02/21/2023   Procedure: RIGHT KNEE ARTHROSCOPY WITH MEDIAL MENISCAL ROOT  REPAIR;  Surgeon: Huel Cote, MD;  Location: [facility];  Service: Orthopedics;  Laterality: Right;   Patient Active Problem List   Diagnosis Date Noted   Tear of medial meniscus of right knee, current 02/21/2023   Snoring 08/16/2022   Insomnia 08/16/2022   Hypercholesterolemia 10/31/2012   Vitamin D deficiency 10/30/2012   ALLERGIC RHINITIS 04/10/2009     REFERRING PROVIDER: Huel Cote, MD  REFERRING DIAG: [phone] (ICD-10-CM) - Acute medial meniscus tear of right knee, initial encounter  THERAPY DIAG:  Right knee pain, unspecified chronicity  Stiffness of right knee, not elsewhere classified  Muscle weakness (generalized)  Difficulty in walking, not elsewhere  classified  Rationale for Evaluation and Treatment: Rehabilitation  ONSET DATE: DOS 02/21/2023  SUBJECTIVE:   SUBJECTIVE STATEMENT: Pt is 5 weeks s/p R knee medial meniscal root repair.     Pt states she messaged MD about calf pain though didn't see calf pain due to schedules.  Her calf started feeling better on Saturday and she is not having any calf pain currently.  She denies knee pain currently.  She stopped the band exercise as PT instructed though has returned to doing it now.  Pt denies any adverse effects after prior Rx.  Pt has been using a SPC and feels good ambulating with cane and brace at 0-70 deg.  Pt does walk in home some without brace though does use her cane.  She has also been wearing tennis shoes which helps.        PERTINENT HISTORY: R knee medial meniscal root repair on 02/21/23.  TDWB'ing for 2 weeks with brace locked in extension. X ray findings of early osteoarthritis and MRI findings of tricompartmental deg changes most significant at the patellofemoral joint.  PAIN:  Are you having pain? Yes NPRS: No pain in calf or knee at rest.  4-5/10 pain in R calf with ambulation, 0/10 knee pain with ambulation.   PRECAUTIONS: Other: per surgical protocol and x-ray/MRI findings  WEIGHT BEARING RESTRICTIONS: Yes    FALLS:  Has patient fallen in last 6 months? No  LIVING ENVIRONMENT: Lives with: lives with their spouse Lives in: 2 story home Stairs: yes Has following equipment at home: Dan Humphreys - 2 wheeled and Crutches  OCCUPATION: Pt is an Scientist, product/process development.   PLOF: Independent.  Pt was able to perform her ADLs/IADLs and functional mobility skills independently.  Pt ambulated without an AD.  She was able to dance and perform her walking program.   PATIENT GOALS: wants to be able to perform her walking program, take yoga  NEXT MD VISIT: 03/08/2023  OBJECTIVE:   DIAGNOSTIC FINDINGS: X rays on 1/22:  IMPRESSION: Early osteoarthritis with trace patellofemoral and  lateral tibiofemoral spurring. MRI findings indicated tricompartmental degenerative changes most significant at the patellofemoral joint.  TODAY'S TREATMENT:      Pt performed: Supine heel slides 2x10  Supine SLR 3x10 with 2# S/L hip abd 2x10 with 2# S/L hip add 2x10 Prone hip extension 2x10  PT updated HEP and gave pt a HEP handout.  Educated pt in correct form and appropriate frequency.  PT instructed pt to not perform supine heel slides into a painful or tight range.       Gait: PT unlocked brace to 0-90 deg and Pt ambulated with brace at 0-90 deg with SPC.  Pt had decreased heel strike which improved with cuing and instruction.  PT educated pt in heel to toe gait pattern.  Pt ambulated well having good stability and not limping.      PATIENT EDUCATION:  Education details:  Investment banker, operational.  Instructed pt she should not have increased pain with gait.  Educated pt in HEP, post op and protocol restrictions, relevant anatomy, POC, exercise form, and ROM limits on brace.  PT answered pt's and husband's questions.  Person educated: Patient and Spouse Education method: Explanation, Demonstration, Tactile cues, Verbal cues, and Handouts Education comprehension: verbalized understanding, returned demonstration, verbal cues required, tactile cues required, and needs further education  HOME EXERCISE PROGRAM: Access Code: [email or web address] URL: [email or web address] Date: 02/27/2023 Prepared by: Aaron Edelman  Updated HEP: - Sidelying Hip Adduction  - 1 x daily - 7 x weekly - 2 sets - 10 reps - Prone Hip Extension  - 1 x daily - 7 x weekly - 2 sets - 10 reps - Supine Heel Slide  - 2 x daily - 7 x weekly - 1 sets - 10 reps  ASSESSMENT:  CLINICAL IMPRESSION:  Pt presents to Rx reporting much improved calf pain stating her calf is not bothering her.  She has been ambulating with brace at 70 deg and with SPC without adverse effects.  She also is ambulating without brace some with  SPC and feels fine.  She demonstrates much improved gait and does not have a limp with gait today.  PT opened brace to 90 deg and worked on gait with brace and SPC.  Pt ambulated with good stability, without limping, and without pain.  Pt performed exercises per protocol well.  She required cuing for correct form with S/L hip adduction and was able to demo correct form with cuing and repetition.  Pt able to perform supine heel slides without increased pain.  She demonstrates good R knee ROM at this time in protocol.  PT updated HEP and gave pt a HEP handout.  Pt demonstrates good understanding of HEP.    Pt responded well to Rx having no pain in knee or calf after Rx.  She should benefit from cont skilled PT services per protocol to address impairments and goals and to  assist in restoring PLOF.         OBJECTIVE IMPAIRMENTS: Abnormal gait, decreased activity tolerance, decreased endurance, decreased mobility, difficulty walking, decreased ROM, decreased strength, hypomobility, increased edema, impaired flexibility, and pain.   ACTIVITY LIMITATIONS: bending, standing, squatting, stairs, transfers, and bed mobility  PARTICIPATION LIMITATIONS: meal prep, cleaning, laundry, driving, shopping, and community activity  PERSONAL FACTORS:     REHAB POTENTIAL: Good  CLINICAL DECISION MAKING: Stable/uncomplicated  EVALUATION COMPLEXITY: Low   GOALS:   SHORT TERM GOALS: Target date: 03/27/2023  Pt will be independent and compliant with HEP for improved pain, ROM, strength, and function.    2.   Pt will progress with Wb'ing per MD orders without adverse effects for improved mobility.  Baseline:  Goal status: INITIAL  3.  Pt will demo L knee flexion PROM to be 90 deg for improved stiffness and mobility.  Baseline:  Goal status: INITIAL  4.  Pt will demo a good quad set and perform a supine SLR independently without significant extensor lag for improved strength.  Baseline:  Goal status:  INITIAL  5.  Pt will be able to perform a 6 inch step up with good form and control.  Baseline:  Goal status: INITIAL Target date:  05/08/2023   6.  Pt will ambulate with a normalized heel to toe gait pattern without AD.  Baseline:  Goal status: INITIAL Target date:  05/22/2023  7.  Pt will demo L knee AROM to 0 - 115 deg for improved mobility and stiffness.  Baseline:  Goal status: INITIAL Target date:  05/22/2023  8.  Pt will score 0-1 on the lateral step down test on a 4 inch step for improved quad eccentric control and performance of stairs.  Goal Status:  INITIAL  Target date:  05/29/2023   LONG TERM GOALS: Target date: 06/19/2023   Pt will ambulate extended community distance without difficulty and significant pain.    2. Pt will be able to perform stairs with a reciprocal gait with the rail.   Baseline:  Goal status: INITIAL  3.  Pt will be able to perform her ADLs and IADLs including household chores without significant pain and difficulty.  Baseline:  Goal status: INITIAL  4.   Pt will demo 5/5 strength in L hip flex and abd and at least 4+/5 strength in L knee flex and extension for improved performance of and tolerance with functional mobility.  Baseline:  Goal status: INITIAL      PLAN:  PT FREQUENCY: 1-2x/week  PT DURATION: other: 16 weeks  PLANNED INTERVENTIONS: Therapeutic exercises, Therapeutic activity, Neuromuscular re-education, Balance training, Gait training, Patient/Family education, Self Care, Joint mobilization, Stair training, DME instructions, Aquatic Therapy, Dry Needling, Electrical stimulation, Cryotherapy, Moist heat, scar mobilization, Taping, Ultrasound, Manual therapy, and Re-evaluation  PLAN FOR NEXT SESSION: Cont per Dr. Serena Croissant meniscus repair protocol.    Audie Clear III PT, DPT 03/28/23 5:32 PM

## 2023-03-30 ENCOUNTER — Ambulatory Visit (HOSPITAL_BASED_OUTPATIENT_CLINIC_OR_DEPARTMENT_OTHER): Payer: 59 | Attending: Orthopaedic Surgery | Admitting: Physical Therapy

## 2023-03-30 ENCOUNTER — Encounter (HOSPITAL_BASED_OUTPATIENT_CLINIC_OR_DEPARTMENT_OTHER): Payer: Self-pay | Admitting: Physical Therapy

## 2023-03-30 DIAGNOSIS — M25561 Pain in right knee: Secondary | ICD-10-CM | POA: Insufficient documentation

## 2023-03-30 DIAGNOSIS — M25661 Stiffness of right knee, not elsewhere classified: Secondary | ICD-10-CM | POA: Insufficient documentation

## 2023-03-30 DIAGNOSIS — R262 Difficulty in walking, not elsewhere classified: Secondary | ICD-10-CM | POA: Diagnosis present

## 2023-03-30 DIAGNOSIS — M6281 Muscle weakness (generalized): Secondary | ICD-10-CM

## 2023-03-30 NOTE — Therapy (Signed)
OUTPATIENT PHYSICAL THERAPY TREATMENT NOTE   Patient Name: Katie Beck MRN: [id number] DOB:August 29, 1971, 52 y.o., female Today's Date: 03/30/2023  END OF SESSION:  PT End of Session - 03/30/23 0821     Visit Number 6    Number of Visits 29    Date for PT Re-Evaluation 05/22/23    Authorization Type [employer]    PT Start Time 0819   Pt arrived 19 mins late to Rx.   PT Stop Time 0853    PT Time Calculation (min) 34 min    Activity Tolerance Patient tolerated treatment well    Behavior During Therapy [facility] for tasks assessed/performed                Past Medical History:  Diagnosis Date   Anemia 10/30/2012   Complication of anesthesia    Reaction to Septocaine w/ epi during dental procedure in office- lightheaded, palpitations/anxiety   CTS (carpal tunnel syndrome)    Medial meniscus tear    root tear 09/2022   PCO (polycystic ovaries)    conceived on metformin   Vestibular neuronitis 07/27/2014   ED visit for vertigo: CT angio neck normal   Vitamin D deficiency 10/30/2012   Past Surgical History:  Procedure Laterality Date   CESAREAN SECTION  2009 &2002   KNEE ARTHROSCOPY WITH MENISCAL REPAIR Right 02/21/2023   Procedure: RIGHT KNEE ARTHROSCOPY WITH MEDIAL MENISCAL ROOT  REPAIR;  Surgeon: Huel Cote, MD;  [facility];  Service: Orthopedics;  Laterality: Right;   Patient Active Problem List   Diagnosis Date Noted   Tear of medial meniscus of right knee, current 02/21/2023   Snoring 08/16/2022   Insomnia 08/16/2022   Hypercholesterolemia 10/31/2012   Vitamin D deficiency 10/30/2012   ALLERGIC RHINITIS 04/10/2009     REFERRING PROVIDER: Huel Cote, MD  REFERRING DIAG: [phone] (ICD-10-CM) - Acute medial meniscus tear of right knee, initial encounter  THERAPY DIAG:  Right knee pain, unspecified chronicity  Stiffness of right knee, not elsewhere classified  Muscle weakness (generalized)  Difficulty in walking, not elsewhere  classified  Rationale for Evaluation and Treatment: Rehabilitation  ONSET DATE: DOS 02/21/2023  SUBJECTIVE:   SUBJECTIVE STATEMENT: Pt is 5 weeks and 2 days s/p R knee medial meniscal root repair.  Pt denies any adverse effects after prior Rx.  Pt states she some soreness with exercises though no pain.  She has some stiffness.  Pt denies any calf pain.  Pt has no adverse effects ambulating with brace with and without SPC.      PERTINENT HISTORY: R knee medial meniscal root repair on 02/21/23.  TDWB'ing for 2 weeks with brace locked in extension. X ray findings of early osteoarthritis and MRI findings of tricompartmental deg changes most significant at the patellofemoral joint.  PAIN:  Are you having pain? Yes NPRS: No pain in calf or knee at rest.  4-5/10 pain in R calf with ambulation, 0/10 knee pain with ambulation.   PRECAUTIONS: Other: per surgical protocol and x-ray/MRI findings  WEIGHT BEARING RESTRICTIONS: Yes    FALLS:  Has patient fallen in last 6 months? No  LIVING ENVIRONMENT: Lives with: lives with their spouse Lives in: 2 story home Stairs: yes Has following equipment at home: Dan Humphreys - 2 wheeled and Crutches  OCCUPATION: Pt is an Scientist, product/process development.   PLOF: Independent.  Pt was able to perform her ADLs/IADLs and functional mobility skills independently.  Pt ambulated without an AD.  She was able to dance and perform  her walking program.   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 MRI findings indicated tricompartmental degenerative changes most significant at the patellofemoral joint.  TODAY'S TREATMENT:     R knee AROM: 0 -116 deg  Pt performed: Supine heel slides 2x10  Supine SLR 3x10 with 2# S/L hip abd 3x10 with 2# S/L hip add 3x10 Prone hip  extension 2x10 Standing heel raises in brace 2x10  Pt received 4D patellar mobs.  PT educated pt concerning HEP.       Gait: Pt ambulated down hallway without brace with and without SPC with cuing for heel to toe gait pattern.  Pt ambulated well having good stability without pain.        PATIENT EDUCATION:  Education details:  Investment banker, operational.  Instructed pt she should not have increased pain with gait.  Educated pt in HEP, post op and protocol restrictions, relevant anatomy, POC, and exercise form.  PT answered pt's and husband's questions including concerning performing stairs.  Person educated: Patient and Spouse Education method: Explanation, Demonstration, Tactile cues, Verbal cues, and Handouts Education comprehension: verbalized understanding, returned demonstration, verbal cues required, tactile cues required, and needs further education  HOME EXERCISE PROGRAM: Access Code: [email or web address] URL: [email or web address] Date: 02/27/2023 Prepared by: Aaron Edelman   ASSESSMENT:  CLINICAL IMPRESSION:  Rx time limited today due to pt arriving late to Rx.  Pt is progressing well with ROM, strength, gait, and protocol.  She has excellent knee ROM.  Pt ambulated well without brace with and without cane.  She had good stability and no increased pain.  Pt does have increased stance time on L LE with slightly decreased Wb'ing on R LE ambulating without SPC.  Pt has improved heel strike.  Pt performed exercises per protocol well without c/o's.  She demonstrates improved form with S/L hip adduction.  Pt had no calf pain with heel raises.  Pt responded well to Rx having no pain after Rx.  She should benefit from cont skilled PT services per protocol to address impairments and goals and to assist in restoring PLOF.            OBJECTIVE IMPAIRMENTS: Abnormal gait, decreased activity tolerance, decreased endurance, decreased mobility, difficulty walking, decreased ROM, decreased  strength, hypomobility, increased edema, impaired flexibility, and pain.   ACTIVITY LIMITATIONS: bending, standing, squatting, stairs, transfers, and bed mobility  PARTICIPATION LIMITATIONS: meal prep, cleaning, laundry, driving, shopping, and community activity  PERSONAL FACTORS:     REHAB POTENTIAL: Good  CLINICAL DECISION MAKING: Stable/uncomplicated  EVALUATION COMPLEXITY: Low   GOALS:   SHORT TERM GOALS: Target date: 03/27/2023  Pt will be independent and compliant with HEP for improved pain, ROM, strength, and function.    2.   Pt will progress with Wb'ing per MD orders without adverse effects for improved mobility.  Baseline:  Goal status: INITIAL  3.  Pt will demo L knee flexion PROM to be 90 deg for improved stiffness and mobility.  Baseline:  Goal status: INITIAL  4.  Pt will demo a good quad set and perform a supine SLR independently without significant extensor lag for improved strength.  Baseline:  Goal status: INITIAL  5.  Pt will be able to perform a 6 inch step up with good form and control.  Baseline:  Goal status: INITIAL  Target date:  05/08/2023   6.  Pt will ambulate with a normalized heel to toe gait pattern without AD.  Baseline:  Goal status: INITIAL Target date:  05/22/2023  7.  Pt will demo L knee AROM to 0 - 115 deg for improved mobility and stiffness.  Baseline:  Goal status: INITIAL Target date:  05/22/2023  8.  Pt will score 0-1 on the lateral step down test on a 4 inch step for improved quad eccentric control and performance of stairs.  Goal Status:  INITIAL  Target date:  05/29/2023   LONG TERM GOALS: Target date: 06/19/2023   Pt will ambulate extended community distance without difficulty and significant pain.    2. Pt will be able to perform stairs with a reciprocal gait with the rail.   Baseline:  Goal status: INITIAL  3.  Pt will be able to perform her ADLs and IADLs including household chores without significant pain and  difficulty.  Baseline:  Goal status: INITIAL  4.   Pt will demo 5/5 strength in L hip flex and abd and at least 4+/5 strength in L knee flex and extension for improved performance of and tolerance with functional mobility.  Baseline:  Goal status: INITIAL      PLAN:  PT FREQUENCY: 1-2x/week  PT DURATION: other: 16 weeks  PLANNED INTERVENTIONS: Therapeutic exercises, Therapeutic activity, Neuromuscular re-education, Balance training, Gait training, Patient/Family education, Self Care, Joint mobilization, Stair training, DME instructions, Aquatic Therapy, Dry Needling, Electrical stimulation, Cryotherapy, Moist heat, scar mobilization, Taping, Ultrasound, Manual therapy, and Re-evaluation  PLAN FOR NEXT SESSION: Cont per Dr. Serena Croissant meniscus repair protocol.    Audie Clear III PT, DPT 03/30/23 10:33 PM

## 2023-04-04 ENCOUNTER — Encounter (HOSPITAL_BASED_OUTPATIENT_CLINIC_OR_DEPARTMENT_OTHER): Payer: Self-pay | Admitting: Physical Therapy

## 2023-04-04 ENCOUNTER — Ambulatory Visit (HOSPITAL_BASED_OUTPATIENT_CLINIC_OR_DEPARTMENT_OTHER): Payer: 59 | Admitting: Physical Therapy

## 2023-04-04 DIAGNOSIS — M25561 Pain in right knee: Secondary | ICD-10-CM | POA: Diagnosis not present

## 2023-04-04 DIAGNOSIS — M6281 Muscle weakness (generalized): Secondary | ICD-10-CM

## 2023-04-04 DIAGNOSIS — R262 Difficulty in walking, not elsewhere classified: Secondary | ICD-10-CM

## 2023-04-04 DIAGNOSIS — M25661 Stiffness of right knee, not elsewhere classified: Secondary | ICD-10-CM

## 2023-04-04 NOTE — Therapy (Signed)
OUTPATIENT PHYSICAL THERAPY TREATMENT NOTE   Patient Name: Katie Beck MRN: [id number] DOB:May 03, 1971, 52 y.o., female Today's Date: 04/05/2023  END OF SESSION:  PT End of Session - 04/04/23 0848     Visit Number 7    Number of Visits 29    Date for PT Re-Evaluation 05/22/23    Authorization Type UHC    PT Start Time 0808    PT Stop Time 0854    PT Time Calculation (min) 46 min    Activity Tolerance Patient tolerated treatment well    Behavior During Therapy [facility] for tasks assessed/performed                 Past Medical History:  Diagnosis Date   Anemia 10/30/2012   Complication of anesthesia    Reaction to Septocaine w/ epi during dental procedure in office- lightheaded, palpitations/anxiety   CTS (carpal tunnel syndrome)    Medial meniscus tear    root tear 09/2022   PCO (polycystic ovaries)    conceived on metformin   Vestibular neuronitis 07/27/2014   ED visit for vertigo: CT angio neck normal   Vitamin D deficiency 10/30/2012   Past Surgical History:  Procedure Laterality Date   CESAREAN SECTION  2009 &2002   KNEE ARTHROSCOPY WITH MENISCAL REPAIR Right 02/21/2023   Procedure: RIGHT KNEE ARTHROSCOPY WITH MEDIAL MENISCAL ROOT  REPAIR;  Surgeon: Huel Cote, MD;  Location: [facility];  Service: Orthopedics;  Laterality: Right;   Patient Active Problem List   Diagnosis Date Noted   Tear of medial meniscus of right knee, current 02/21/2023   Snoring 08/16/2022   Insomnia 08/16/2022   Hypercholesterolemia 10/31/2012   Vitamin D deficiency 10/30/2012   ALLERGIC RHINITIS 04/10/2009     REFERRING PROVIDER: Huel Cote, MD  REFERRING DIAG: [phone] (ICD-10-CM) - Acute medial meniscus tear of right knee, initial encounter  THERAPY DIAG:  Right knee pain, unspecified chronicity  Stiffness of right knee, not elsewhere classified  Muscle weakness (generalized)  Difficulty in walking, not elsewhere classified  Rationale for  Evaluation and Treatment: Rehabilitation  ONSET DATE: DOS 02/21/2023  SUBJECTIVE:   SUBJECTIVE STATEMENT: Pt is 6 weeks s/p R knee medial meniscal root repair.  Pt denies any adverse effects after prior Rx.  Pt states she hasn't been using the cane for the past 2 days though did bring her cane in.  Pt hasn't been using the brace since Friday.  Pt states she has some discomfort in medial knee, tenderness to touch, minimal swelling, and "more popping sounds" at the end of the day.  Pt denies pain with the popping.  Pt drove here today and felt fine.  Pt denies pain currently.  Pt sees MD tomorrow.     PERTINENT HISTORY: R knee medial meniscal root repair on 02/21/23.  TDWB'ing for 2 weeks with brace locked in extension. X ray findings of early osteoarthritis and MRI findings of tricompartmental deg changes most significant at the patellofemoral joint.  PAIN:  Are you having pain? Yes NPRS: No pain in calf or knee at rest  PRECAUTIONS: Other: per surgical protocol and x-ray/MRI findings  WEIGHT BEARING RESTRICTIONS: Yes    FALLS:  Has patient fallen in last 6 months? No  LIVING ENVIRONMENT: Lives with: lives with their spouse Lives in: 2 story home Stairs: yes Has following equipment at home: Dan Humphreys - 2 wheeled and Crutches  OCCUPATION: Pt is an Scientist, product/process development.   PLOF: Independent.  Pt was able to perform her ADLs/IADLs  and functional mobility skills independently.  Pt ambulated without an AD.  She was able to dance and perform her walking program.   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 MRI findings indicated tricompartmental degenerative changes most significant at the patellofemoral joint.  TODAY'S TREATMENT:       Pt performed: Standing heel raises 3x10 TKE  with YTB 2x10 and RTB x 10 Supine heel slides 2x10  Supine SLR 3x10 with 2# S/L hip abd 3x10 with 2# S/L hip add 3x10 Prone hip extension x10 with 0# and 2x10 with 1#   PT updated HEP and gave pt a HEP handout.  Educated pt in correct form and appropriate frequency.  PT instructed pt she should not have pain with exercises.  Reviewed HEP and answered pt's questions concerning frequency of HEP.    See below for pt education.     Gait: Pt ambulated down hallway without brace and without SPC with cuing for heel to toe gait pattern.  Pt ambulated well having good stability without pain.  She has limited TKE and is minimally favoring R LE        PATIENT EDUCATION:  Education details:  PT spent time answering questions and educating pt concerning surgical protocol and recovery process.  PT instructed pt in icing knee.  Gait training.  PT instructed pt to decrease walking and Wb'ing if having increased swelling.  Educated pt in HEP, post op and protocol restrictions, relevant anatomy, POC, and exercise form.    Person educated: Patient Education method: Explanation, Demonstration, Tactile cues, Verbal cues, and Handouts Education comprehension: verbalized understanding, returned demonstration, verbal cues required, tactile cues required, and needs further education  HOME EXERCISE PROGRAM: Access Code: [email or web address] URL: [email or web address] Date: 02/27/2023 Prepared by: Aaron Edelman  - Heel Raises with Counter Support  - 1 x daily - 5-6 x weekly - 2-3 sets - 10 reps - Standing Terminal Knee Extension with Resistance  - 1 x daily - 5 x weekly - 3 sets - 10 reps   ASSESSMENT:  CLINICAL IMPRESSION:  Pt is making great progress in all areas.  She is not using the brace anymore and is ambulating with decreased usage of cane.  Pt does minimally favor R LE with gait though continues to improve with gait.  She has good stability with ambulation.  Pt is improving with R LE strength  as evidenced by performance of exercises.  She is progressing appropriately with protocol and has excellent knee ROM.  Pt responded well to Rx having no pain after Rx.  She should benefit from cont skilled PT services per protocol to address impairments and goals and to assist in restoring PLOF.            OBJECTIVE IMPAIRMENTS: Abnormal gait, decreased activity tolerance, decreased endurance, decreased mobility, difficulty walking, decreased ROM, decreased strength, hypomobility, increased edema, impaired flexibility, and pain.   ACTIVITY LIMITATIONS: bending, standing, squatting, stairs, transfers, and bed mobility  PARTICIPATION LIMITATIONS: meal prep, cleaning, laundry, driving, shopping, and community activity  PERSONAL FACTORS:     REHAB POTENTIAL: Good  CLINICAL DECISION MAKING: Stable/uncomplicated  EVALUATION COMPLEXITY: Low   GOALS:   SHORT TERM GOALS: Target date: 03/27/2023  Pt will be independent and compliant with HEP for improved pain, ROM, strength, and function.    2.  Pt will progress with Wb'ing per MD orders without adverse effects for improved mobility.  Baseline:  Goal status: INITIAL  3.  Pt will demo L knee flexion PROM to be 90 deg for improved stiffness and mobility.  Baseline:  Goal status: INITIAL  4.  Pt will demo a good quad set and perform a supine SLR independently without significant extensor lag for improved strength.  Baseline:  Goal status: INITIAL  5.  Pt will be able to perform a 6 inch step up with good form and control.  Baseline:  Goal status: INITIAL Target date:  05/08/2023   6.  Pt will ambulate with a normalized heel to toe gait pattern without AD.  Baseline:  Goal status: INITIAL Target date:  05/22/2023  7.  Pt will demo L knee AROM to 0 - 115 deg for improved mobility and stiffness.  Baseline:  Goal status: INITIAL Target date:  05/22/2023  8.  Pt will score 0-1 on the lateral step down test on a 4 inch step for  improved quad eccentric control and performance of stairs.  Goal Status:  INITIAL  Target date:  05/29/2023   LONG TERM GOALS: Target date: 06/19/2023   Pt will ambulate extended community distance without difficulty and significant pain.    2. Pt will be able to perform stairs with a reciprocal gait with the rail.   Baseline:  Goal status: INITIAL  3.  Pt will be able to perform her ADLs and IADLs including household chores without significant pain and difficulty.  Baseline:  Goal status: INITIAL  4.   Pt will demo 5/5 strength in L hip flex and abd and at least 4+/5 strength in L knee flex and extension for improved performance of and tolerance with functional mobility.  Baseline:  Goal status: INITIAL      PLAN:  PT FREQUENCY: 1-2x/week  PT DURATION: other: 16 weeks  PLANNED INTERVENTIONS: Therapeutic exercises, Therapeutic activity, Neuromuscular re-education, Balance training, Gait training, Patient/Family education, Self Care, Joint mobilization, Stair training, DME instructions, Aquatic Therapy, Dry Needling, Electrical stimulation, Cryotherapy, Moist heat, scar mobilization, Taping, Ultrasound, Manual therapy, and Re-evaluation  PLAN FOR NEXT SESSION: Cont per Dr. Serena Croissant meniscus repair protocol.    Audie Clear III PT, DPT 04/05/23 8:37 AM

## 2023-04-05 ENCOUNTER — Ambulatory Visit (INDEPENDENT_AMBULATORY_CARE_PROVIDER_SITE_OTHER): Payer: 59 | Admitting: Orthopaedic Surgery

## 2023-04-05 DIAGNOSIS — S83241A Other tear of medial meniscus, current injury, right knee, initial encounter: Secondary | ICD-10-CM

## 2023-04-05 NOTE — Progress Notes (Signed)
Post Operative Evaluation    Procedure/Date of Surgery: Right knee arthroscopy with medial meniscal repair 3/26  Interval History:     Presents today for follow-up 6 weeks status post above procedure.  Overall she is continuing to improve.  She is making improvements with strengthening.  She is here today for discussion.  She is having some sharp stabbing pain in the front of the knee.   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   ED visit for vertigo: CT angio neck normal   Vitamin D deficiency 10/30/2012   Past Surgical History:  Procedure Laterality Date   CESAREAN SECTION  2009 &2002   KNEE ARTHROSCOPY WITH MENISCAL REPAIR Right 02/21/2023   Procedure: RIGHT KNEE ARTHROSCOPY WITH MEDIAL MENISCAL ROOT  REPAIR;  Surgeon: Huel Cote, MD;  Location: [facility];  Service: Orthopedics;  Laterality: Right;   Social History   Socioeconomic History   Marital status: Married    Spouse name: Not on file   Number of children: Not on file   Years of education: Not on file   Highest education level: Not on file  Occupational History   Occupation: IT  Tobacco Use   Smoking status: Never   Smokeless tobacco: Never  Vaping Use   Vaping Use: Never used  Substance and Sexual Activity   Alcohol use: Yes    Comment: occasional   Drug use: No   Sexual activity: Yes    Partners: Male    Birth control/protection: None  Other Topics Concern   Not on file  Social History Narrative   [place] country of origin        works outside Freight forwarder level  education      HH of 4 (50 y/o, 50 y/o + married).         pets no        NO ets or tobacco.         Vegetarian      Smoking Status:  never      Caffeine use/day:  3-4      Does Patient Exercise:  no         Social Determinants of Corporate investment banker Strain: Not on file  Food Insecurity: Not on file  Transportation Needs: Not on file  Physical Activity: Not on file  Stress: Not on file  Social Connections: Not on file   Family History  Problem Relation Age of Onset   Diabetes Father    Hypertension Father    Hypertension Sister    Diabetes Sister    Hypertension Mother    Diabetes Mother    Hyperlipidemia Mother    Thyroid disease Mother    Diabetes Maternal Grandfather    Pancreatic cancer Brother        pancreatic   Allergies  Allergen Reactions   Other Anxiety, Palpitations and Other (See Comments)    SEPTOCAINE  w/ EPI  Used during a dental procedure- caused dizziness/ lightheaded/ heart pounding and anxiety   Current Outpatient Medications  Medication Sig Dispense Refill   aspirin EC 325 MG tablet Take 1 tablet (325 mg total) by mouth daily. 30 tablet 0   calcium carbonate (OS-CAL) 600 MG TABS tablet Take 1 tablet (600 mg total) by mouth 2 (two) times daily with a meal. 60 tablet 12   cetirizine (ZYRTEC) 10 MG tablet Take 10 mg by mouth daily as needed for allergies.     Multiple Vitamin (MULTIVITAMIN) tablet Take 1 tablet by mouth daily.     oxyCODONE (ROXICODONE) 5 MG immediate release tablet Take 1 tablet (5 mg total) by mouth every 4 (four) hours as needed for severe pain or breakthrough pain. (Patient not taking: Reported on 02/27/2023) 10 tablet 0   Vitamin D, Ergocalciferol, (DRISDOL) 1.25 MG (50000 UNIT) CAPS capsule TAKE 1 CAPSULE (50,000 UNITS TOTAL) BY MOUTH EVERY 7 (SEVEN) DAYS 4 capsule 0   vitamin E 180 MG (400 UNITS) capsule Take 1 capsule (400 Units total) by mouth 2 (two) times daily with a meal. 60 capsule 12   No current  facility-administered medications for this visit.   No results found.  Review of Systems:   A ROS was performed including pertinent positives and negatives as documented in the HPI.   Musculoskeletal Exam:    Right knee incision is well-appearing without erythema or drainage.  Range of motion is from 0 to 120 degrees.  Positive Tinel at the saphenous nerve Imaging:      I personally reviewed and interpreted the radiographs.   Assessment:   6 weeks status post right knee medial meniscal repair overall doing very well.  At this time she is progressing with her weightbearing.  She has having some occasional popping which I do believe is attributed to some scar tissue in the front of the knee.  Having said she is continuing to improve with strengthening and range of motion without pain with weightbearing.  At this time I do believe that she is progressing I would like her to work in physical therapy for continued strengthening.  I will plan to see her back in 6 weeks  Plan :    -Return to clinic in 6 weeks for reassessment      I personally saw and evaluated the patient, and participated in the management and treatment plan.  Huel Cote, MD Attending Physician, Orthopedic Surgery  This document was dictated using Dragon voice recognition software. A reasonable attempt at proof reading has been made to minimize errors.

## 2023-04-06 ENCOUNTER — Ambulatory Visit (HOSPITAL_BASED_OUTPATIENT_CLINIC_OR_DEPARTMENT_OTHER): Payer: 59 | Admitting: Physical Therapy

## 2023-04-06 DIAGNOSIS — R262 Difficulty in walking, not elsewhere classified: Secondary | ICD-10-CM

## 2023-04-06 DIAGNOSIS — M6281 Muscle weakness (generalized): Secondary | ICD-10-CM

## 2023-04-06 DIAGNOSIS — M25561 Pain in right knee: Secondary | ICD-10-CM

## 2023-04-06 DIAGNOSIS — M25661 Stiffness of right knee, not elsewhere classified: Secondary | ICD-10-CM

## 2023-04-06 NOTE — Therapy (Signed)
OUTPATIENT PHYSICAL THERAPY TREATMENT NOTE   Patient Name: Katie Beck MRN: [id number] DOB:[birth year], 52 y.o., female Today's Date: 04/07/2023  END OF SESSION:  PT End of Session - 04/06/23 0806     Visit Number 8    Number of Visits 29    Date for PT Re-Evaluation 05/22/23    Authorization Type UHC    PT Start Time 0804    PT Stop Time 0846    PT Time Calculation (min) 42 min    Activity Tolerance Patient tolerated treatment well    Behavior During Therapy [facility] for tasks assessed/performed                 Past Medical History:  Diagnosis Date   Anemia 10/30/2012   Complication of anesthesia    Reaction to Septocaine w/ epi during dental procedure in office- lightheaded, palpitations/anxiety   CTS (carpal tunnel syndrome)    Medial meniscus tear    root tear 09/2022   PCO (polycystic ovaries)    conceived on metformin   Vestibular neuronitis 07/27/2014   ED visit for vertigo: CT angio neck normal   Vitamin D deficiency 10/30/2012   Past Surgical History:  Procedure Laterality Date   CESAREAN SECTION  2009 &2002   KNEE ARTHROSCOPY WITH MENISCAL REPAIR Right 02/21/2023   Procedure: RIGHT KNEE ARTHROSCOPY WITH MEDIAL MENISCAL ROOT  REPAIR;  Surgeon: Huel Cote, MD;  Location: [facility];  Service: Orthopedics;  Laterality: Right;   Patient Active Problem List   Diagnosis Date Noted   Tear of medial meniscus of right knee, current 02/21/2023   Snoring 08/16/2022   Insomnia 08/16/2022   Hypercholesterolemia 10/31/2012   Vitamin D deficiency 10/30/2012   ALLERGIC RHINITIS 04/10/2009     REFERRING PROVIDER: Huel Cote, MD  REFERRING DIAG: [phone] (ICD-10-CM) - Acute medial meniscus tear of right knee, initial encounter  THERAPY DIAG:  Right knee pain, unspecified chronicity  Stiffness of right knee, not elsewhere classified  Muscle weakness (generalized)  Difficulty in walking, not elsewhere classified  Rationale for  Evaluation and Treatment: Rehabilitation  ONSET DATE: DOS 02/21/2023  SUBJECTIVE:   SUBJECTIVE STATEMENT: Pt is 6 weeks and 2 days s/p R knee medial meniscal root repair.  Pt saw MD yesterday and he was pleased with progress.  Pt states MD wants her to work on strengthening and MD note indicates to continue with PT and work on Print production planner.  Pt states she gets some pinching and pain in anterior knee which she told MD.  He thinks it is scar tissue at her portal sites and instructed her to massage it.  Pt states when she touches her medial portal she feels it in her lateral knee.  Pt denies any adverse effects after prior Rx.  Pt is not using her AD.     PERTINENT HISTORY: R knee medial meniscal root repair on 02/21/23.  TDWB'ing for 2 weeks with brace locked in extension. X ray findings of early osteoarthritis and MRI findings of tricompartmental deg changes most significant at the patellofemoral joint.  PAIN:  NPRS: Pt denies pain currently  PRECAUTIONS: Other: per surgical protocol and x-ray/MRI findings  WEIGHT BEARING RESTRICTIONS: Yes    FALLS:  Has patient fallen in last 6 months? No  LIVING ENVIRONMENT: Lives with: lives with their spouse Lives in: 2 story home Stairs: yes Has following equipment at home: Dan Humphreys - 2 wheeled and Crutches  OCCUPATION: Pt is an Scientist, product/process development.   PLOF: Independent.  Pt was able  to perform her ADLs/IADLs and functional mobility skills independently.  Pt ambulated without an AD.  She was able to dance and perform her walking program.   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 MRI findings indicated tricompartmental degenerative changes most significant at the patellofemoral joint.  TODAY'S TREATMENT:     R knee AROM:  0 - 121  deg  Pt performed: Standing heel raises 3x10 TKE with RTB 3 x 10 Supine heel slides x10  Supine SLR 3x10 with 2# S/L hip abd 3x10 with 2.5# S/L hip add x10 AROM and 2x10 with 1# above knee Prone hip extension x15 with 1# and x 10 with 2# Standing hip abd 2x10 R LE only Longsitting gastroc stretch 3x30 sec      See below for pt education.  Reviewed HEP.  Manual Therapy:  PT performed gentle scar tissue massage on portals and educated pt with how to perform scar tissue massage at home.       PATIENT EDUCATION:  Education details:  PT answered pt's questions.  PT used an anatomic model to educate pt in anatomy including medial meniscus.  PT educated pt in scar tissue massage.  Educated pt in HEP, post op and protocol restrictions, relevant anatomy, POC, and exercise form.    Person educated: Patient Education method: Explanation, Demonstration, Tactile cues, Verbal cues Education comprehension: verbalized understanding, returned demonstration, verbal cues required, tactile cues required, and needs further education  HOME EXERCISE PROGRAM: Access Code: [email or web address] URL: [email or web address] Date: 02/27/2023 Prepared by: Aaron Edelman   ASSESSMENT:  CLINICAL IMPRESSION:  Pt is making great progress in all areas.  Pt enters the clinic without cane and is not using brace.  She is improving with quality of gait including heel to toe gait pattern and Wb'ing/stance time thru R LE.  Pt saw MD who informed her she has some scar tissue under portal and wanted her to perform scar tissue massage.  PT performed gentle scar tissue massage to portals and instructed pt in how to perform at home.  Pt demonstrates good understanding.  Pt has excellent knee AROM.  Pt performed exercises per protocol well with cuing for correct form without c/o's.  Pt has met STG's #1-4 and 7.  Pt responded well to Rx having no pain after Rx.  She should benefit from cont skilled PT services per protocol to  address impairments and goals and to assist in restoring PLOF.              OBJECTIVE IMPAIRMENTS: Abnormal gait, decreased activity tolerance, decreased endurance, decreased mobility, difficulty walking, decreased ROM, decreased strength, hypomobility, increased edema, impaired flexibility, and pain.   ACTIVITY LIMITATIONS: bending, standing, squatting, stairs, transfers, and bed mobility  PARTICIPATION LIMITATIONS: meal prep, cleaning, laundry, driving, shopping, and community activity  PERSONAL FACTORS:     REHAB POTENTIAL: Good  CLINICAL DECISION MAKING: Stable/uncomplicated  EVALUATION COMPLEXITY: Low   GOALS:   SHORT TERM GOALS: Target date: 03/27/2023  Pt will be independent and compliant with HEP for improved pain, ROM, strength, and function.  Goal status:  GOAL MET   2.   Pt will progress with Wb'ing per MD orders without adverse effects for improved mobility.  Baseline:  Goal status: GOAL MET  3.  Pt will demo L knee flexion PROM to be 90  deg for improved stiffness and mobility.  Baseline:  Goal status: GOAL MET  4.  Pt will demo a good quad set and perform a supine SLR independently without significant extensor lag for improved strength.  Baseline:  Goal status: GOAL MET  5.  Pt will be able to perform a 6 inch step up with good form and control.  Baseline:  Goal status: INITIAL Target date:  05/08/2023   6.  Pt will ambulate with a normalized heel to toe gait pattern without AD.  Baseline:  Goal status: INITIAL Target date:  05/22/2023  7.  Pt will demo L knee AROM to 0 - 115 deg for improved mobility and stiffness.  Baseline:  Goal status: GOAL MET Target date:  05/22/2023  8.  Pt will score 0-1 on the lateral step down test on a 4 inch step for improved quad eccentric control and performance of stairs.  Goal Status:  INITIAL  Target date:  05/29/2023   LONG TERM GOALS: Target date: 06/19/2023   Pt will ambulate extended community distance  without difficulty and significant pain.    2. Pt will be able to perform stairs with a reciprocal gait with the rail.   Baseline:  Goal status: INITIAL  3.  Pt will be able to perform her ADLs and IADLs including household chores without significant pain and difficulty.  Baseline:  Goal status: INITIAL  4.   Pt will demo 5/5 strength in L hip flex and abd and at least 4+/5 strength in L knee flex and extension for improved performance of and tolerance with functional mobility.  Baseline:  Goal status: INITIAL      PLAN:  PT FREQUENCY: 1-2x/week  PT DURATION: other: 16 weeks  PLANNED INTERVENTIONS: Therapeutic exercises, Therapeutic activity, Neuromuscular re-education, Balance training, Gait training, Patient/Family education, Self Care, Joint mobilization, Stair training, DME instructions, Aquatic Therapy, Dry Needling, Electrical stimulation, Cryotherapy, Moist heat, scar mobilization, Taping, Ultrasound, Manual therapy, and Re-evaluation  PLAN FOR NEXT SESSION: Cont per Dr. Serena Croissant meniscus repair protocol.    Audie Clear III PT, DPT 04/07/23 10:58 AM

## 2023-04-07 ENCOUNTER — Encounter (HOSPITAL_BASED_OUTPATIENT_CLINIC_OR_DEPARTMENT_OTHER): Payer: Self-pay | Admitting: Physical Therapy

## 2023-04-11 ENCOUNTER — Ambulatory Visit (HOSPITAL_BASED_OUTPATIENT_CLINIC_OR_DEPARTMENT_OTHER): Payer: 59 | Admitting: Physical Therapy

## 2023-04-11 DIAGNOSIS — M25561 Pain in right knee: Secondary | ICD-10-CM | POA: Diagnosis not present

## 2023-04-11 DIAGNOSIS — R262 Difficulty in walking, not elsewhere classified: Secondary | ICD-10-CM

## 2023-04-11 DIAGNOSIS — M25661 Stiffness of right knee, not elsewhere classified: Secondary | ICD-10-CM

## 2023-04-11 DIAGNOSIS — M6281 Muscle weakness (generalized): Secondary | ICD-10-CM

## 2023-04-11 NOTE — Therapy (Signed)
OUTPATIENT PHYSICAL THERAPY TREATMENT NOTE   Patient Name: Katie Beck MRN: [id number] DOB:May 27, 1971, 52 y.o., female Today's Date: 04/12/2023  END OF SESSION:  PT End of Session - 04/11/23 1405     Visit Number 9    Number of Visits 29    Date for PT Re-Evaluation 05/22/23    Authorization Type [employer]    PT Start Time 1321    PT Stop Time 1359    PT Time Calculation (min) 38 min    Activity Tolerance Patient tolerated treatment well    Behavior During Therapy [facility] for tasks assessed/performed                  Past Medical History:  Diagnosis Date   Anemia 10/30/2012   Complication of anesthesia    Reaction to Septocaine w/ epi during dental procedure in office- lightheaded, palpitations/anxiety   CTS (carpal tunnel syndrome)    Medial meniscus tear    root tear 09/2022   PCO (polycystic ovaries)    conceived on metformin   Vestibular neuronitis 07/27/2014   ED visit for vertigo: CT angio neck normal   Vitamin D deficiency 10/30/2012   Past Surgical History:  Procedure Laterality Date   CESAREAN SECTION  2009 &2002   KNEE ARTHROSCOPY WITH MENISCAL REPAIR Right 02/21/2023   Procedure: RIGHT KNEE ARTHROSCOPY WITH MEDIAL MENISCAL ROOT  REPAIR;  Surgeon: Huel Cote, MD;  Location: [facility];  Service: Orthopedics;  Laterality: Right;   Patient Active Problem List   Diagnosis Date Noted   Tear of medial meniscus of right knee, current 02/21/2023   Snoring 08/16/2022   Insomnia 08/16/2022   Hypercholesterolemia 10/31/2012   Vitamin D deficiency 10/30/2012   ALLERGIC RHINITIS 04/10/2009     REFERRING PROVIDER: Huel Cote, MD  REFERRING DIAG: [phone] (ICD-10-CM) - Acute medial meniscus tear of right knee, initial encounter  THERAPY DIAG:  Right knee pain, unspecified chronicity  Stiffness of right knee, not elsewhere classified  Muscle weakness (generalized)  Difficulty in walking, not elsewhere classified  Rationale for  Evaluation and Treatment: Rehabilitation  ONSET DATE: DOS 02/21/2023  SUBJECTIVE:   SUBJECTIVE STATEMENT: Pt is 7 weeks s/p R knee medial meniscal root repair.  Pt did a lot more walking this past weekend due to her daughter's graduation including having to perform stairs.  Pt reports having increased stiffness and soreness after the increased walking.  Pt states she has been having some pain with ambulation.  Pt has iced it once.  Pt has been performing scar tissue massage on portals and incision.  Pt states she feels it in her lateral knee when she presses on her incision.  Pt states her bending is better.  She can walk a little more and has more flexibility with walking.      PERTINENT HISTORY: R knee medial meniscal root repair on 02/21/23.  TDWB'ing for 2 weeks with brace locked in extension. X ray findings of early osteoarthritis and MRI findings of tricompartmental deg changes most significant at the patellofemoral joint.  PAIN:  NPRS: Pt denies pain currently at rest. 2-3/10 pain with ambulation  PRECAUTIONS: Other: per surgical protocol and x-ray/MRI findings  WEIGHT BEARING RESTRICTIONS: Yes    FALLS:  Has patient fallen in last 6 months? No  LIVING ENVIRONMENT: Lives with: lives with their spouse Lives in: 2 story home Stairs: yes Has following equipment at home: Dan Humphreys - 2 wheeled and Crutches  OCCUPATION: Pt is an Scientist, product/process development.   PLOF: Independent.  Pt was able to perform her ADLs/IADLs and functional mobility skills independently.  Pt ambulated without an AD.  She was able to dance and perform her walking program.   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 MRI findings indicated tricompartmental degenerative changes most significant at the patellofemoral  joint.  TODAY'S TREATMENT:     Pt performed: TKE with RTB 3 x 10  Supine SLR 3x10 with 2# S/L hip abd 3x10 with 2.5# S/L hip add x10 with 1.5# above knee and 2x10 with 2# above knee Prone hip extension 3x10 with 2# Standing hip abd 2x10 R LE only Standing hip ext 2x10 R LE only Longsitting gastroc stretch 3x30 sec  Pt received 4D patellar mobs f/b R knee flex and extension PROM in supine.      See below for pt education.  Reviewed HEP.     Gait:  Pt ambulated with a heel to toe gait pattern with occasionally limited TKE.  Pt had no limp.  Pt reports 2/10 pain with ambulation.   PATIENT EDUCATION:  Education details:  PT spent time answering questions concerning post op and protocol expectations.  PT instructed pt in icing her knee more frequently.  Educated pt in HEP, post op and protocol restrictions, relevant anatomy, POC, and exercise form.    Person educated: Patient Education method: Explanation, Demonstration, Tactile cues, Verbal cues Education comprehension: verbalized understanding, returned demonstration, verbal cues required, tactile cues required, and needs further education  HOME EXERCISE PROGRAM: Access Code: [email or web address] URL: [email or web address] Date: 02/27/2023 Prepared by: Aaron Edelman   ASSESSMENT:  CLINICAL IMPRESSION:  Pt is making great progress in all areas.  Pt does report having more soreness recently and having some pain with ambulation.  She has been performing increased ambulation including going to her daughter's graduation.  PT answered her questions and educated pt concerning post op expectations.  PT instructed pt to ice her knee more and educated pt with appropriate duration.   Pt agreed.  She tolerates exercises per protocol well and performed exercises per protocol well without c/o's.  Pt is improving with gait having no limp though did report 2/10 pain with ambulation.  She responded well to Rx having no pain after Rx.  Pt  declined ice and stated she would ice at home.  She should benefit from cont skilled PT services per protocol to address impairments and goals and to assist in restoring PLOF.           OBJECTIVE IMPAIRMENTS: Abnormal gait, decreased activity tolerance, decreased endurance, decreased mobility, difficulty walking, decreased ROM, decreased strength, hypomobility, increased edema, impaired flexibility, and pain.   ACTIVITY LIMITATIONS: bending, standing, squatting, stairs, transfers, and bed mobility  PARTICIPATION LIMITATIONS: meal prep, cleaning, laundry, driving, shopping, and community activity  PERSONAL FACTORS:     REHAB POTENTIAL: Good  CLINICAL DECISION MAKING: Stable/uncomplicated  EVALUATION COMPLEXITY: Low   GOALS:   SHORT TERM GOALS: Target date: 03/27/2023  Pt will be independent and compliant with HEP for improved pain, ROM, strength, and function.  Goal status:  GOAL MET   2.   Pt will progress with Wb'ing per MD orders without adverse effects for improved mobility.  Baseline:  Goal status: GOAL MET  3.  Pt will demo L knee flexion PROM to be 90 deg for improved stiffness  and mobility.  Baseline:  Goal status: GOAL MET  4.  Pt will demo a good quad set and perform a supine SLR independently without significant extensor lag for improved strength.  Baseline:  Goal status: GOAL MET  5.  Pt will be able to perform a 6 inch step up with good form and control.  Baseline:  Goal status: INITIAL Target date:  05/08/2023   6.  Pt will ambulate with a normalized heel to toe gait pattern without AD.  Baseline:  Goal status: INITIAL Target date:  05/22/2023  7.  Pt will demo L knee AROM to 0 - 115 deg for improved mobility and stiffness.  Baseline:  Goal status: GOAL MET Target date:  05/22/2023  8.  Pt will score 0-1 on the lateral step down test on a 4 inch step for improved quad eccentric control and performance of stairs.  Goal Status:  INITIAL  Target date:   05/29/2023   LONG TERM GOALS: Target date: 06/19/2023   Pt will ambulate extended community distance without difficulty and significant pain.    2. Pt will be able to perform stairs with a reciprocal gait with the rail.   Baseline:  Goal status: INITIAL  3.  Pt will be able to perform her ADLs and IADLs including household chores without significant pain and difficulty.  Baseline:  Goal status: INITIAL  4.   Pt will demo 5/5 strength in L hip flex and abd and at least 4+/5 strength in L knee flex and extension for improved performance of and tolerance with functional mobility.  Baseline:  Goal status: INITIAL      PLAN:  PT FREQUENCY: 1-2x/week  PT DURATION: other: 16 weeks  PLANNED INTERVENTIONS: Therapeutic exercises, Therapeutic activity, Neuromuscular re-education, Balance training, Gait training, Patient/Family education, Self Care, Joint mobilization, Stair training, DME instructions, Aquatic Therapy, Dry Needling, Electrical stimulation, Cryotherapy, Moist heat, scar mobilization, Taping, Ultrasound, Manual therapy, and Re-evaluation  PLAN FOR NEXT SESSION: Cont per Dr. Serena Croissant meniscus repair protocol.   Audie Clear III PT, DPT 04/12/23 8:50 PM

## 2023-04-12 ENCOUNTER — Encounter (HOSPITAL_BASED_OUTPATIENT_CLINIC_OR_DEPARTMENT_OTHER): Payer: Self-pay | Admitting: Physical Therapy

## 2023-04-19 ENCOUNTER — Ambulatory Visit (HOSPITAL_BASED_OUTPATIENT_CLINIC_OR_DEPARTMENT_OTHER): Payer: 59 | Admitting: Physical Therapy

## 2023-04-19 ENCOUNTER — Encounter (HOSPITAL_BASED_OUTPATIENT_CLINIC_OR_DEPARTMENT_OTHER): Payer: Self-pay | Admitting: Physical Therapy

## 2023-04-19 DIAGNOSIS — M25661 Stiffness of right knee, not elsewhere classified: Secondary | ICD-10-CM

## 2023-04-19 DIAGNOSIS — M6281 Muscle weakness (generalized): Secondary | ICD-10-CM

## 2023-04-19 DIAGNOSIS — R262 Difficulty in walking, not elsewhere classified: Secondary | ICD-10-CM

## 2023-04-19 DIAGNOSIS — M25561 Pain in right knee: Secondary | ICD-10-CM

## 2023-04-19 NOTE — Therapy (Signed)
OUTPATIENT PHYSICAL THERAPY TREATMENT NOTE / PROGRESS NOTE   Patient Name: Katie Beck MRN: [id number] DOB:[birth year], 52 y.o., female Today's Date: 04/20/2023  END OF SESSION:  PT End of Session - 04/19/23 0835     Visit Number 10    Number of Visits 29    Date for PT Re-Evaluation 06/19/23    Authorization Type U[employer]    PT Start Time 0810    PT Stop Time 0845    PT Time Calculation (min) 35 min    Activity Tolerance Patient tolerated treatment well    Behavior During Therapy [facility] for tasks assessed/performed                  Past Medical History:  Diagnosis Date   Anemia 10/30/2012   Complication of anesthesia    Reaction to Septocaine w/ epi during dental procedure in office- lightheaded, palpitations/anxiety   CTS (carpal tunnel syndrome)    Medial meniscus tear    root tear 09/2022   PCO (polycystic ovaries)    conceived on metformin   Vestibular neuronitis 07/27/2014   ED visit for vertigo: CT angio neck normal   Vitamin D deficiency 10/30/2012   Past Surgical History:  Procedure Laterality Date   CESAREAN SECTION  2009 &2002   KNEE ARTHROSCOPY WITH MENISCAL REPAIR Right 02/21/2023   Procedure: RIGHT KNEE ARTHROSCOPY WITH MEDIAL MENISCAL ROOT  REPAIR;  Surgeon: Huel Cote, MD;  Location: [facility];  Service: Orthopedics;  Laterality: Right;   Patient Active Problem List   Diagnosis Date Noted   Tear of medial meniscus of right knee, current 02/21/2023   Snoring 08/16/2022   Insomnia 08/16/2022   Hypercholesterolemia 10/31/2012   Vitamin D deficiency 10/30/2012   ALLERGIC RHINITIS 04/10/2009     REFERRING PROVIDER: Huel Cote, MD  REFERRING DIAG: [phone] (ICD-10-CM) - Acute medial meniscus tear of right knee, initial encounter  THERAPY DIAG:  Right knee pain, unspecified chronicity  Stiffness of right knee, not elsewhere classified  Muscle weakness (generalized)  Difficulty in walking, not elsewhere  classified  Rationale for Evaluation and Treatment: Rehabilitation  ONSET DATE: DOS 02/21/2023  SUBJECTIVE:   SUBJECTIVE STATEMENT: Pt is 8 weeks and 1 day s/p R knee medial meniscal root repair.   Pt reports compliance with HEP.  Pt reports having some soreness after exercises.  Pt states she has had a little swelling though no increased swelling with the exercises.  Pt denies any adverse effects after prior Rx.  Pt was in the car for 5-6 hours riding to [facility] and had stiffness and soreness.   Pt reports improved flexibility.  She has increased her ambulation distance. Pt states she is walking and standing more.  Pt is using ice at home.    PERTINENT HISTORY: R knee medial meniscal root repair on 02/21/23.   X ray findings of early osteoarthritis and MRI findings of tricompartmental deg changes most significant at the patellofemoral joint.  PAIN:  NPRS: Pt denies pain currently at rest. 1-2/10 pain with ambulation, Worst pain: 2/10 Pt does have tenderness if she presses on her knee.  PRECAUTIONS: Other: per surgical protocol and x-ray/MRI findings  WEIGHT BEARING RESTRICTIONS: Yes    FALLS:  Has patient fallen in last 6 months? No  LIVING ENVIRONMENT: Lives with: lives with their spouse Lives in: 2 story home Stairs: yes Has following equipment at home: Dan Humphreys - 2 wheeled and Crutches  OCCUPATION: Pt is an Scientist, product/process development.   PLOF: Independent.  Pt was  able to perform her ADLs/IADLs and functional mobility skills independently.  Pt ambulated without an AD.  She was able to dance and perform her walking program.   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 MRI findings indicated tricompartmental degenerative changes most significant at the patellofemoral  joint.  TODAY'S TREATMENT:     Pt performed: Recumbent bike x 5 mins Standing heel raises 2x10 Mini squats 2x10 Step ups on 4 inch step 2x10 Prone HS curl 2x10      See below for pt education.  PT educated pt concerning HEP including appropriate frequency.  R knee AROM:  0 - 127 deg     Gait:  Pt ambulated with a good heel to toe gait pattern without limping.  Pt has decreased gait speed. Pt reports 0/10 pain with ambulation.   FOTO:  22 / 44.  Goal of 59  PATIENT EDUCATION:  Education details:  PT instructed pt in icing her knee more frequently.  Educated pt in HEP, post op and protocol restrictions, objective findings, progress, relevant anatomy, POC, and exercise form.    Person educated: Patient Education method: Explanation, Demonstration, Tactile cues, Verbal cues Education comprehension: verbalized understanding, returned demonstration, verbal cues required, tactile cues required, and needs further education  HOME EXERCISE PROGRAM: Access Code: [email or web address] URL: [email or web address] Date: 02/27/2023 Prepared by: Aaron Edelman   ASSESSMENT:  CLINICAL IMPRESSION:  Pt is making excellent progress in all areas.  Pt does report having more soreness recently and having some pain with ambulation.  She is increasing her ambulation distance and ambulates with a good heel to toe gait pattern without limping.  She has excellent knee ROM.  Pt is progressing appropriately with strength including quad strength as evidenced by performance and progression of exercises including performance of supine SLR.  PT progressed exercises per protocol today and she tolerated progression well. She performed exercises per protocol well with cuing and instruction in correct form.  Pt has met STG's #1-4 and #7.  Pt demonstrates improved self perceived disability with FOTO score improving from 22 initially to 44 currently.  Pt is progressing with functional mobility and has expected  limitations per protocol.  She responded well to Rx reporting no pain after Rx and stating she feels better, more flexible.  She should benefit from cont skilled PT services per protocol to address impairments and ongoing goals and to assist in restoring PLOF.           OBJECTIVE IMPAIRMENTS: Abnormal gait, decreased activity tolerance, decreased endurance, decreased mobility, difficulty walking, decreased ROM, decreased strength, hypomobility, increased edema, impaired flexibility, and pain.   ACTIVITY LIMITATIONS: bending, standing, squatting, stairs, transfers, and bed mobility  PARTICIPATION LIMITATIONS: meal prep, cleaning, laundry, driving, shopping, and community activity  PERSONAL FACTORS:     REHAB POTENTIAL: Good  CLINICAL DECISION MAKING: Stable/uncomplicated  EVALUATION COMPLEXITY: Low   GOALS:   SHORT TERM GOALS: Target date: 03/27/2023  Pt will be independent and compliant with HEP for improved pain, ROM, strength, and function.  Goal status:  GOAL MET   2.   Pt will progress with Wb'ing per MD orders without adverse effects for improved mobility.  Baseline:  Goal status: GOAL MET  3.  Pt will demo L knee flexion PROM to be 90 deg for improved stiffness and mobility.  Baseline:  Goal status: GOAL MET  4.  Pt will demo a good quad set and perform a supine SLR independently without significant extensor lag for improved strength.  Baseline:  Goal status: GOAL MET  5.  Pt will be able to perform a 6 inch step up with good form and control.  Baseline:  Goal status: ONGOING Target date:  05/08/2023   6.  Pt will ambulate with a normalized heel to toe gait pattern without AD.  Baseline:  Goal status: PROGRESSING Target date:  05/22/2023  7.  Pt will demo L knee AROM to 0 - 115 deg for improved mobility and stiffness.  Baseline:  Goal status: GOAL MET Target date:  05/22/2023  8.  Pt will score 0-1 on the lateral step down test on a 4 inch step for improved  quad eccentric control and performance of stairs.  Goal Status:  INITIAL  Target date:  05/29/2023   LONG TERM GOALS: Target date: 06/19/2023   Pt will ambulate extended community distance without difficulty and significant pain.    2. Pt will be able to perform stairs with a reciprocal gait with the rail.   Baseline:  Goal status: INITIAL  3.  Pt will be able to perform her ADLs and IADLs including household chores without significant pain and difficulty.  Baseline:  Goal status: INITIAL  4.   Pt will demo 5/5 strength in L hip flex and abd and at least 4+/5 strength in L knee flex and extension for improved performance of and tolerance with functional mobility.  Baseline:  Goal status: INITIAL      PLAN:  PT FREQUENCY: 1-2x/week  PT DURATION: other: 9 weeks  PLANNED INTERVENTIONS: Therapeutic exercises, Therapeutic activity, Neuromuscular re-education, Balance training, Gait training, Patient/Family education, Self Care, Joint mobilization, Stair training, DME instructions, Aquatic Therapy, Dry Needling, Electrical stimulation, Cryotherapy, Moist heat, scar mobilization, Taping, Ultrasound, Manual therapy, and Re-evaluation  PLAN FOR NEXT SESSION: Cont per Dr. Serena Croissant meniscus repair protocol.   Audie Clear III PT, DPT 04/20/23 11:31 AM

## 2023-04-21 ENCOUNTER — Ambulatory Visit (HOSPITAL_BASED_OUTPATIENT_CLINIC_OR_DEPARTMENT_OTHER): Payer: 59

## 2023-04-21 ENCOUNTER — Encounter (HOSPITAL_BASED_OUTPATIENT_CLINIC_OR_DEPARTMENT_OTHER): Payer: Self-pay

## 2023-04-21 DIAGNOSIS — M25661 Stiffness of right knee, not elsewhere classified: Secondary | ICD-10-CM

## 2023-04-21 DIAGNOSIS — M25561 Pain in right knee: Secondary | ICD-10-CM

## 2023-04-21 DIAGNOSIS — M6281 Muscle weakness (generalized): Secondary | ICD-10-CM

## 2023-04-21 DIAGNOSIS — R262 Difficulty in walking, not elsewhere classified: Secondary | ICD-10-CM

## 2023-04-21 NOTE — Therapy (Signed)
OUTPATIENT PHYSICAL THERAPY TREATMENT NOTE    Patient Name: Katie Beck MRN: [id number] DOB:December 24, 1970, 52 y.o., female Today's Date: 04/21/2023  END OF SESSION:  PT End of Session - 04/21/23 0902     Visit Number 11    Number of Visits 29    Date for PT Re-Evaluation 06/19/23    Authorization Type [employer]    PT Start Time 0851    PT Stop Time 0933    PT Time Calculation (min) 42 min    Activity Tolerance Patient tolerated treatment well    Behavior During Therapy [facility] for tasks assessed/performed                  Past Medical History:  Diagnosis Date   Anemia 10/30/2012   Complication of anesthesia    Reaction to Septocaine w/ epi during dental procedure in office- lightheaded, palpitations/anxiety   CTS (carpal tunnel syndrome)    Medial meniscus tear    root tear 09/2022   PCO (polycystic ovaries)    conceived on metformin   Vestibular neuronitis 07/27/2014   ED visit for vertigo: CT angio neck normal   Vitamin D deficiency 10/30/2012   Past Surgical History:  Procedure Laterality Date   CESAREAN SECTION  2009 &2002   KNEE ARTHROSCOPY WITH MENISCAL REPAIR Right 02/21/2023   Procedure: RIGHT KNEE ARTHROSCOPY WITH MEDIAL MENISCAL ROOT  REPAIR;  Surgeon: Huel Cote, MD;  Location: [facility];  Service: Orthopedics;  Laterality: Right;   Patient Active Problem List   Diagnosis Date Noted   Tear of medial meniscus of right knee, current 02/21/2023   Snoring 08/16/2022   Insomnia 08/16/2022   Hypercholesterolemia 10/31/2012   Vitamin D deficiency 10/30/2012   ALLERGIC RHINITIS 04/10/2009     REFERRING PROVIDER: Huel Cote, MD  REFERRING DIAG: [id number] (ICD-10-CM) - Acute medial meniscus tear of right knee, initial encounter  THERAPY DIAG:  Right knee pain, unspecified chronicity  Difficulty in walking, not elsewhere classified  Stiffness of right knee, not elsewhere classified  Muscle weakness (generalized)  Rationale for  Evaluation and Treatment: Rehabilitation  ONSET DATE: DOS 02/21/2023  SUBJECTIVE:   SUBJECTIVE STATEMENT: Pt reports 2-4/10 pain level in medial knee after exercises, but no pain at entry.     PERTINENT HISTORY: R knee medial meniscal root repair on 02/21/23.   X ray findings of early osteoarthritis and MRI findings of tricompartmental deg changes most significant at the patellofemoral joint.  PAIN:  NPRS: Pt denies pain currently at rest. 1-2/10 pain with ambulation, Worst pain: 2/10 Pt does have tenderness if she presses on her knee.  PRECAUTIONS: Other: per surgical protocol and x-ray/MRI findings  WEIGHT BEARING RESTRICTIONS: Yes    FALLS:  Has patient fallen in last 6 months? No  LIVING ENVIRONMENT: Lives with: lives with their spouse Lives in: 2 story home Stairs: yes Has following equipment at home: Dan Humphreys - 2 wheeled and Crutches  OCCUPATION: Pt is an Scientist, product/process development.   PLOF: Independent.  Pt was able to perform her ADLs/IADLs and functional mobility skills independently.  Pt ambulated without an AD.  She was able to dance and perform her walking program.   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 MRI findings indicated tricompartmental degenerative changes most significant at the patellofemoral joint.  TODAY'S TREATMENT:  Pt performed: Recumbent bike x 5 mins Standing heel/toe raises 2x10 Mini squats 2x10 Step ups on 4 inch step 2x10ea fwd and lateral Partial lunges 2x10 R Shuttle press 75lbs (inclined position) 2x10 SLS 20sec x2R  Prone HS curl 2x10 Seated HSC RTB 2x10 Supine SLR on elbows 3x10 Sidelying hip abduction 3x10R Single leg bridge 2x10  PATIENT EDUCATION:  Education details:  PT instructed pt in icing her knee more frequently.   Educated pt in HEP, post op and protocol restrictions, objective findings, progress, relevant anatomy, POC, and exercise form.    Person educated: Patient Education method: Explanation, Demonstration, Tactile cues, Verbal cues Education comprehension: verbalized understanding, returned demonstration, verbal cues required, tactile cues required, and needs further education  HOME EXERCISE PROGRAM: Access Code: [email or web address] URL: [email or web address] Date: 02/27/2023 Prepared by: Aaron Edelman   ASSESSMENT:  CLINICAL IMPRESSION:  Pt with excellent tolerance for PT interventions today. She had mild difficulty with partial lunges due to weakness. Discussed HEP with pt and added progressions. Reviewed use of MedBridge GO app. Pt expressed concerns that her knee bothers her by the end of the day. PTA discussed with pt healing timeline and expectations for her surgery. Will continue to monitor this and progress with protocol as tolerated.          OBJECTIVE IMPAIRMENTS: Abnormal gait, decreased activity tolerance, decreased endurance, decreased mobility, difficulty walking, decreased ROM, decreased strength, hypomobility, increased edema, impaired flexibility, and pain.   ACTIVITY LIMITATIONS: bending, standing, squatting, stairs, transfers, and bed mobility  PARTICIPATION LIMITATIONS: meal prep, cleaning, laundry, driving, shopping, and community activity  PERSONAL FACTORS:     REHAB POTENTIAL: Good  CLINICAL DECISION MAKING: Stable/uncomplicated  EVALUATION COMPLEXITY: Low   GOALS:   SHORT TERM GOALS: Target date: 03/27/2023  Pt will be independent and compliant with HEP for improved pain, ROM, strength, and function.  Goal status:  GOAL MET   2.   Pt will progress with Wb'ing per MD orders without adverse effects for improved mobility.  Baseline:  Goal status: GOAL MET  3.  Pt will demo L knee flexion PROM to be 90 deg for improved stiffness and mobility.   Baseline:  Goal status: GOAL MET  4.  Pt will demo a good quad set and perform a supine SLR independently without significant extensor lag for improved strength.  Baseline:  Goal status: GOAL MET  5.  Pt will be able to perform a 6 inch step up with good form and control.  Baseline:  Goal status: ONGOING Target date:  05/08/2023   6.  Pt will ambulate with a normalized heel to toe gait pattern without AD.  Baseline:  Goal status: PROGRESSING Target date:  05/22/2023  7.  Pt will demo L knee AROM to 0 - 115 deg for improved mobility and stiffness.  Baseline:  Goal status: GOAL MET Target date:  05/22/2023  8.  Pt will score 0-1 on the lateral step down test on a 4 inch step for improved quad eccentric control and performance of stairs.  Goal Status:  INITIAL  Target date:  05/29/2023   LONG TERM GOALS: Target date: 06/19/2023   Pt will ambulate extended community distance without difficulty and significant pain.    2. Pt will be able to perform stairs with a reciprocal gait with the rail.   Baseline:  Goal status: INITIAL  3.  Pt will be able to perform her ADLs and IADLs including household chores without significant pain and difficulty.  Baseline:  Goal  status: INITIAL  4.   Pt will demo 5/5 strength in L hip flex and abd and at least 4+/5 strength in L knee flex and extension for improved performance of and tolerance with functional mobility.  Baseline:  Goal status: INITIAL      PLAN:  PT FREQUENCY: 1-2x/week  PT DURATION: other: 9 weeks  PLANNED INTERVENTIONS: Therapeutic exercises, Therapeutic activity, Neuromuscular re-education, Balance training, Gait training, Patient/Family education, Self Care, Joint mobilization, Stair training, DME instructions, Aquatic Therapy, Dry Needling, Electrical stimulation, Cryotherapy, Moist heat, scar mobilization, Taping, Ultrasound, Manual therapy, and Re-evaluation  PLAN FOR NEXT SESSION: Cont per Dr. Serena Croissant meniscus  repair protocol.   Riki Altes, PTA  04/21/23 10:04 AM

## 2023-04-26 ENCOUNTER — Ambulatory Visit (HOSPITAL_BASED_OUTPATIENT_CLINIC_OR_DEPARTMENT_OTHER): Payer: 59

## 2023-04-26 ENCOUNTER — Encounter (HOSPITAL_BASED_OUTPATIENT_CLINIC_OR_DEPARTMENT_OTHER): Payer: Self-pay

## 2023-04-26 DIAGNOSIS — M6281 Muscle weakness (generalized): Secondary | ICD-10-CM

## 2023-04-26 DIAGNOSIS — M25561 Pain in right knee: Secondary | ICD-10-CM

## 2023-04-26 DIAGNOSIS — R262 Difficulty in walking, not elsewhere classified: Secondary | ICD-10-CM

## 2023-04-26 DIAGNOSIS — M25661 Stiffness of right knee, not elsewhere classified: Secondary | ICD-10-CM

## 2023-04-26 NOTE — Therapy (Addendum)
 OUTPATIENT PHYSICAL THERAPY TREATMENT NOTE  PHYSICAL THERAPY DISCHARGE SUMMARY  Visits from Start of Care: 12  Current functional level related to goals / functional outcomes: Improved with indep   Remaining deficits: unknown   Education / Equipment: Management of condition/HEP   Patient agrees to discharge. Patient goals were partially met. Patient is being discharged due to not returning since the last visit.  Addend Corrie Dandy Tomma Lightning) Ziemba MPT 01/19/24 10:49 AM [facility] [address] [place], [place], [address] Phone: [phone]   Fax:  [phone]     Patient Name: Katie Beck MRN: [id number] DOB:March 16, 1971, 52 y.o., female Today's Date: 04/26/2023  END OF SESSION:  PT End of Session -     Visit Number 12   Number of Visits 29   Date for PT Re-Evaluation 06/19/23   Authorization Type    PT Start Time 807    PT Stop Time 847   PT Time Calculation (min) 40   Activity Tolerance Tol well   Behavior During Therapy  wfl           Past Medical History:  Diagnosis Date   Anemia 10/30/2012   Complication of anesthesia    Reaction to Septocaine w/ epi during dental procedure in office- lightheaded, palpitations/anxiety   CTS (carpal tunnel syndrome)    Medial meniscus tear    root tear 09/2022   PCO (polycystic ovaries)    conceived on metformin   Vestibular neuronitis 07/27/2014   ED visit for vertigo: CT angio neck normal   Vitamin D deficiency 10/30/2012   Past Surgical History:  Procedure Laterality Date   CESAREAN SECTION  2009 &2002   KNEE ARTHROSCOPY WITH MENISCAL REPAIR Right 02/21/2023   Procedure: RIGHT KNEE ARTHROSCOPY WITH MEDIAL MENISCAL ROOT  REPAIR;  Surgeon: Huel Cote, MD;  Location: [facility];  Service: Orthopedics;  Laterality: Right;   Patient Active Problem List   Diagnosis Date Noted   Tear of medial meniscus of right knee, current 02/21/2023    Snoring 08/16/2022   Insomnia 08/16/2022   Hypercholesterolemia 10/31/2012   Vitamin D deficiency 10/30/2012   ALLERGIC RHINITIS 04/10/2009     REFERRING PROVIDER: Huel Cote, MD  REFERRING DIAG: [id number] (ICD-10-CM) - Acute medial meniscus tear of right knee, initial encounter  THERAPY DIAG:  No diagnosis found.  Rationale for Evaluation and Treatment: Rehabilitation  ONSET DATE: DOS 02/21/2023  SUBJECTIVE:   SUBJECTIVE STATEMENT: Pt reports mild soreness in R knee, but no significant pain.     PERTINENT HISTORY: R knee medial meniscal root repair on 02/21/23.   X ray findings of early osteoarthritis and MRI findings of tricompartmental deg changes most significant at the patellofemoral joint.  PAIN:  NPRS: 2/10 anterior knee 1-2/10 pain with ambulation, Worst pain: 2/10 Pt does have tenderness if she presses on her knee.  PRECAUTIONS: Other: per surgical protocol and x-ray/MRI findings  WEIGHT BEARING RESTRICTIONS: Yes    FALLS:  Has patient fallen in last 6 months? No  LIVING ENVIRONMENT: Lives with: lives with their spouse Lives in: 2 story home Stairs: yes Has following equipment at home: Dan Humphreys - 2 wheeled and Crutches  OCCUPATION: Pt is an Scientist, product/process development.   PLOF: Independent.  Pt was able to perform her ADLs/IADLs and functional mobility skills independently.  Pt ambulated without an AD.  She was able to dance and perform her walking program.   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 MRI findings indicated tricompartmental degenerative changes most significant at the patellofemoral joint.  TODAY'S TREATMENT:     Pt performed: Recumbent bike x 5 mins Standing heel/toe raises 2x15 Mini squats 2x10 Step ups on 6 inch step 2x10 fwd and 4"  lateral 2x10 Partial lunges 2x10 R SLS 15sec x3R airex pad Walking marches hall x2 Retro/fwd walking hall x1ea SLDL to cone on 6" step 2x10  Seated HSC RTB 2x10 Supine SLR on elbows 1# 3x10 Sidelying hip abduction 3x10R 2# Single leg bridge 2x15  PATIENT EDUCATION:  Education details:  PT instructed pt in icing her knee more frequently.  Educated pt in HEP, post op and protocol restrictions, objective findings, progress, relevant anatomy, POC, and exercise form.    Person educated: Patient Education method: Explanation, Demonstration, Tactile cues, Verbal cues Education comprehension: verbalized understanding, returned demonstration, verbal cues required, tactile cues required, and needs further education  HOME EXERCISE PROGRAM: Access Code: [email or web address] URL: [email or web address] Date: 02/27/2023 Prepared by: Aaron Edelman   ASSESSMENT:  CLINICAL IMPRESSION:  Good tolerance for gentle progressions today. She demonstrates good strength and endurance in R quad and hip. Added compliant surface to balance challenge which pt did have mild unsteadiness with.  Worked on functional strengthening with pt requiring cues for equal WB during squats. Added SLDL to elevated surface which was moderately challenging.  Pt will be leaving to go out of the [place] next [place] and will require updates to HEP in next 2 visits.           OBJECTIVE IMPAIRMENTS: Abnormal gait, decreased activity tolerance, decreased endurance, decreased mobility, difficulty walking, decreased ROM, decreased strength, hypomobility, increased edema, impaired flexibility, and pain.   ACTIVITY LIMITATIONS: bending, standing, squatting, stairs, transfers, and bed mobility  PARTICIPATION LIMITATIONS: meal prep, cleaning, laundry, driving, shopping, and community activity  PERSONAL FACTORS:     REHAB POTENTIAL: Good  CLINICAL DECISION MAKING: Stable/uncomplicated  EVALUATION COMPLEXITY:  Low   GOALS:   SHORT TERM GOALS: Target date: 03/27/2023  Pt will be independent and compliant with HEP for improved pain, ROM, strength, and function.  Goal status:  GOAL MET   2.   Pt will progress with Wb'ing per MD orders without adverse effects for improved mobility.  Baseline:  Goal status: GOAL MET  3.  Pt will demo L knee flexion PROM to be 90 deg for improved stiffness and mobility.  Baseline:  Goal status: GOAL MET  4.  Pt will demo a good quad set and perform a supine SLR independently without significant extensor lag for improved strength.  Baseline:  Goal status: GOAL MET  5.  Pt will be able to perform a 6 inch step up with good form and control.  Baseline:  Goal status: ONGOING Target date:  05/08/2023   6.  Pt will ambulate with a normalized heel to toe gait pattern without AD.  Baseline:  Goal status: PROGRESSING Target date:  05/22/2023  7.  Pt will demo L knee AROM to 0 - 115 deg for improved mobility and stiffness.  Baseline:  Goal status: GOAL MET Target date:  05/22/2023  8.  Pt will score 0-1 on the lateral step down test on a 4 inch step for improved quad eccentric control and performance of stairs.  Goal Status:  INITIAL  Target date:  05/29/2023   LONG TERM GOALS: Target  date: 06/19/2023   Pt will ambulate extended community distance without difficulty and significant pain.    2. Pt will be able to perform stairs with a reciprocal gait with the rail.   Baseline:  Goal status: INITIAL  3.  Pt will be able to perform her ADLs and IADLs including household chores without significant pain and difficulty.  Baseline:  Goal status: INITIAL  4.   Pt will demo 5/5 strength in L hip flex and abd and at least 4+/5 strength in L knee flex and extension for improved performance of and tolerance with functional mobility.  Baseline:  Goal status: INITIAL      PLAN:  PT FREQUENCY: 1-2x/week  PT DURATION: other: 9 weeks  PLANNED  INTERVENTIONS: Therapeutic exercises, Therapeutic activity, Neuromuscular re-education, Balance training, Gait training, Patient/Family education, Self Care, Joint mobilization, Stair training, DME instructions, Aquatic Therapy, Dry Needling, Electrical stimulation, Cryotherapy, Moist heat, scar mobilization, Taping, Ultrasound, Manual therapy, and Re-evaluation  PLAN FOR NEXT SESSION: Cont per Dr. Serena Croissant meniscus repair protocol.   Riki Altes, PTA  04/26/23 7:47 AM

## 2023-05-02 ENCOUNTER — Ambulatory Visit (HOSPITAL_BASED_OUTPATIENT_CLINIC_OR_DEPARTMENT_OTHER): Payer: 59 | Admitting: Physical Therapy

## 2023-05-04 ENCOUNTER — Ambulatory Visit (INDEPENDENT_AMBULATORY_CARE_PROVIDER_SITE_OTHER): Payer: 59 | Admitting: Orthopaedic Surgery

## 2023-05-04 DIAGNOSIS — S83241A Other tear of medial meniscus, current injury, right knee, initial encounter: Secondary | ICD-10-CM

## 2023-05-04 NOTE — Progress Notes (Signed)
Post Operative Evaluation    Procedure/Date of Surgery: Right knee arthroscopy with medial meniscal repair 3/26  Interval History:     Presents today for follow-up of the above procedure.  She states that she is experiencing some swelling and tightness in the hamstrings posteriorly as well as the pes tendon medially.  She is here today for further discussion.  She is working on Print production planner and has completed her formal physical therapy but is still doing home exercises walks without any type of antalgic gait   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   ED visit for vertigo: CT angio neck normal   Vitamin D deficiency 10/30/2012   Past Surgical History:  Procedure Laterality Date   CESAREAN SECTION  2009 &2002   KNEE ARTHROSCOPY WITH MENISCAL REPAIR Right 02/21/2023   Procedure: RIGHT KNEE ARTHROSCOPY WITH MEDIAL MENISCAL ROOT  REPAIR;  Surgeon: Huel Cote, MD;  Location: [facility];  Service: Orthopedics;  Laterality: Right;   Social History   Socioeconomic History   Marital status: Married    Spouse name: Not on file   Number of children: Not on file   Years of education: Not on file   Highest education level: Not on file  Occupational History   Occupation: IT  Tobacco Use   Smoking status: Never   Smokeless tobacco: Never  Vaping Use   Vaping Use: Never used  Substance and Sexual Activity   Alcohol use: Yes    Comment: occasional   Drug use: No   Sexual activity: Yes    Partners: Male    Birth control/protection: None  Other Topics Concern    Not on file  Social History Narrative   [place] country of origin        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         pets no        NO ets or tobacco.         Vegetarian      Smoking Status:  never      Caffeine use/day:  3-4      Does Patient Exercise:  no         Social Determinants of Health   Financial Resource Strain: Not on file  Food Insecurity: Not on file  Transportation Needs: Not on file  Physical Activity: Not on file  Stress: Not on file  Social Connections: Not on file   Family History  Problem Relation Age of Onset   Diabetes Father    Hypertension Father    Hypertension Sister    Diabetes Sister    Hypertension Mother    Diabetes Mother    Hyperlipidemia Mother    Thyroid disease Mother    Diabetes Maternal Grandfather    Pancreatic cancer Brother        pancreatic  Allergies  Allergen Reactions   Other Anxiety, Palpitations and Other (See Comments)    SEPTOCAINE w/ EPI  Used during a dental procedure- caused dizziness/ lightheaded/ heart pounding and anxiety   Current Outpatient Medications  Medication Sig Dispense Refill   aspirin EC 325 MG tablet Take 1 tablet (325 mg total) by mouth daily. 30 tablet 0   calcium carbonate (OS-CAL) 600 MG TABS tablet Take 1 tablet (600 mg total) by mouth 2 (two) times daily with a meal. 60 tablet 12   cetirizine (ZYRTEC) 10 MG tablet Take 10 mg by mouth daily as needed for allergies.     Multiple Vitamin (MULTIVITAMIN) tablet Take 1 tablet by mouth daily.     oxyCODONE (ROXICODONE) 5 MG immediate release tablet Take 1 tablet (5 mg total) by mouth every 4 (four) hours as needed for severe pain or breakthrough pain. (Patient not taking: Reported on 02/27/2023) 10 tablet 0   Vitamin D, Ergocalciferol, (DRISDOL) 1.25 MG (50000 UNIT) CAPS capsule TAKE 1 CAPSULE (50,000 UNITS TOTAL) BY MOUTH EVERY 7 (SEVEN) DAYS 4 capsule 0   vitamin E 180 MG (400 UNITS)  capsule Take 1 capsule (400 Units total) by mouth 2 (two) times daily with a meal. 60 capsule 12   No current facility-administered medications for this visit.   No results found.  Review of Systems:   A ROS was performed including pertinent positives and negatives as documented in the HPI.   Musculoskeletal Exam:    Right knee incision is well-appearing without erythema or drainage.  Range of motion is from 0 to 130 degrees.  Positive Tinel at the saphenous nerve Imaging:      I personally reviewed and interpreted the radiographs.   Assessment:   10 weeks status post right knee medial meniscal repair overall doing very well.   Swelling is continuing to improve.  She is having some quad weakness and patellofemoral type symptoms today as well as hamstring tightness.  Have given her some exercises to work on this.  I will plan to see her back in 3 months for final check  Plan :    -Return to clinic in 12 weeks for reassessment      I personally saw and evaluated the patient, and participated in the management and treatment plan.  Huel Cote, MD Attending Physician, Orthopedic Surgery  This document was dictated using Dragon voice recognition software. A reasonable attempt at proof reading has been made to minimize errors.

## 2023-05-05 ENCOUNTER — Ambulatory Visit (HOSPITAL_BASED_OUTPATIENT_CLINIC_OR_DEPARTMENT_OTHER): Payer: 59

## 2023-08-04 ENCOUNTER — Ambulatory Visit (HOSPITAL_BASED_OUTPATIENT_CLINIC_OR_DEPARTMENT_OTHER): Payer: 59 | Admitting: Orthopaedic Surgery

## 2023-09-18 ENCOUNTER — Ambulatory Visit (HOSPITAL_BASED_OUTPATIENT_CLINIC_OR_DEPARTMENT_OTHER): Payer: 59 | Admitting: Orthopaedic Surgery

## 2023-09-18 DIAGNOSIS — S83241A Other tear of medial meniscus, current injury, right knee, initial encounter: Secondary | ICD-10-CM | POA: Diagnosis not present

## 2023-09-18 NOTE — Progress Notes (Signed)
Post Operative Evaluation    Procedure/Date of Surgery: Right knee arthroscopy with medial meniscal repair 3/26  Interval History:     Presents today for follow-up of the above procedure.  Overall she is continuing to make slow and steady improvements.  She is now walking without any pain.  There is some mild tenderness about the medial aspect of the knee.  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   ED visit for vertigo: CT angio neck normal   Vitamin D deficiency 10/30/2012   Past Surgical History:  Procedure Laterality Date   CESAREAN SECTION  2009 &2002   KNEE ARTHROSCOPY WITH MENISCAL REPAIR Right 02/21/2023   Procedure: RIGHT KNEE ARTHROSCOPY WITH MEDIAL MENISCAL ROOT  REPAIR;  Surgeon: Huel Cote, MD;  Location: [facility];  Service: Orthopedics;  Laterality: Right;   Social History   Socioeconomic History   Marital status: Married    Spouse name: Not on file   Number of children: Not on file   Years of education: Not on file   Highest education level: Not on file  Occupational History   Occupation: IT  Tobacco Use   Smoking status: Never   Smokeless tobacco: Never  Vaping Use   Vaping status: Never Used  Substance and Sexual Activity   Alcohol use: Yes    Comment: occasional   Drug use: No   Sexual activity: Yes    Partners: Male    Birth control/protection: None  Other Topics Concern   Not on file  Social History Narrative   [place] country of origin        works outside Freight forwarder level education      HH of 4 (50  y/o, 50 y/o + married).         pets no        NO ets or tobacco.         Vegetarian      Smoking Status:  never      Caffeine use/day:  3-4      Does Patient Exercise:  no         Social Determinants of Corporate investment banker Strain: Not on file  Food Insecurity: Not on file  Transportation Needs: Not on file  Physical Activity: Not on file  Stress: Not on file  Social Connections: Not on file   Family History  Problem Relation Age of Onset   Diabetes Father    Hypertension Father    Hypertension Sister    Diabetes Sister    Hypertension Mother    Diabetes Mother    Hyperlipidemia Mother    Thyroid disease Mother    Diabetes Maternal Grandfather    Pancreatic cancer Brother        pancreatic   Allergies  Allergen Reactions   Other Anxiety, Palpitations and Other (See Comments)    SEPTOCAINE w/ EPI  Used during a  dental procedure- caused dizziness/ lightheaded/ heart pounding and anxiety   Current Outpatient Medications  Medication Sig Dispense Refill   aspirin EC 325 MG tablet Take 1 tablet (325 mg total) by mouth daily. 30 tablet 0   calcium carbonate (OS-CAL) 600 MG TABS tablet Take 1 tablet (600 mg total) by mouth 2 (two) times daily with a meal. 60 tablet 12   cetirizine (ZYRTEC) 10 MG tablet Take 10 mg by mouth daily as needed for allergies.     Multiple Vitamin (MULTIVITAMIN) tablet Take 1 tablet by mouth daily.     oxyCODONE (ROXICODONE) 5 MG immediate release tablet Take 1 tablet (5 mg total) by mouth every 4 (four) hours as needed for severe pain or breakthrough pain. (Patient not taking: Reported on 02/27/2023) 10 tablet 0   Vitamin D, Ergocalciferol, (DRISDOL) 1.25 MG (50000 UNIT) CAPS capsule TAKE 1 CAPSULE (50,000 UNITS TOTAL) BY MOUTH EVERY 7 (SEVEN) DAYS 4 capsule 0   vitamin E 180 MG (400 UNITS) capsule Take 1 capsule (400 Units total) by mouth 2 (two) times daily with a meal. 60 capsule 12   No current facility-administered medications for this  visit.   No results found.  Review of Systems:   A ROS was performed including pertinent positives and negatives as documented in the HPI.   Musculoskeletal Exam:    Right knee incision is well-appearing without erythema or drainage.  Range of motion is from 0 to 130 degrees.  Positive Tinel at the saphenous nerve Imaging:      I personally reviewed and interpreted the radiographs.   Assessment:   7 months status post right knee medial meniscal repair overall doing very well.   Swelling is continuing to improve.  Overall she has improved pain and is now walking longer distances up to 2 miles with minimal discomfort.  I will plan to see her back in 5 months  1 year check  Plan :    -Return to clinic in 5 months for reassessment      I personally saw and evaluated the patient, and participated in the management and treatment plan.  Huel Cote, MD Attending Physician, Orthopedic Surgery  This document was dictated using Dragon voice recognition software. A reasonable attempt at proof reading has been made to minimize errors.

## 2024-02-02 ENCOUNTER — Ambulatory Visit: Payer: Self-pay | Admitting: Family Medicine

## 2024-02-02 NOTE — Telephone Encounter (Signed)
 Chief Complaint: covid like sx. Son tested negative for covid with similar sx with at home test. Requesting appt / medication Symptoms: cough light yellow sputum at times. Runny nose light yellow drainage, head feels heavy. Body aches. Fever at times. Has not checked today. Dizziness and fatigue. Taking OTC alkazelter, ibuprofen  Frequency: Wednesday  Pertinent Negatives: Patient denies chest pain no difficulty breathing  Disposition: [] ED /[] Urgent Care (no appt availability in office) / [] Appointment(In office/virtual)/ []  [facility]/ [] Home Care/ [x] Refused Recommended Disposition /[] [facility]/ []  Follow-up with PCP Additional Notes:   No available appt or VV with PCP or other providers in office until Monday . Patient did not want to use another provider from another office. Recommended UC for testing or UC VV . Unsure patient of disposition. Please advise.        Copied from CRM [id number]. Topic: Clinical - Red Word Triage >> Feb 02, 2024 11:23 AM Chantha C wrote: Red Word that prompted transfer to Nurse Triage: Patient is sick, fever, coughing, runny nose, body stiffness, chills, dizziness, and fatigue. Wants to be seen to eval for flu. Patient denies pain, vision issues nor shortness of breath. Please advise [phone]. Reason for Disposition  [1] COVID-19 infection suspected by caller or triager AND [2] mild symptoms (cough, fever, or others) AND [3] negative COVID-19 rapid test  Answer Assessment - Initial Assessment Questions 1. COVID-19 DIAGNOSIS: "How do you know that you have COVID?" (e.g., positive lab test or self-test, diagnosed by doctor or NP/PA, symptoms after exposure).     Not tested but son covid test negative  2. COVID-19 EXPOSURE: "Was there any known exposure to COVID before the symptoms began?" CDC Definition of close contact: within 6 feet (2 meters) for a total of 15 minutes or more over a 24-hour period.      Na  3. ONSET: "When did the  COVID-19 symptoms start?"      Wednesday  4. WORST SYMPTOM: "What is your worst symptom?" (e.g., cough, fever, shortness of breath, muscle aches)     Cough, fever, chills, head feels heavy, runny nose light yellow colored drainage. Body aches.  5. COUGH: "Do you have a cough?" If Yes, ask: "How bad is the cough?"       Cough  6. FEVER: "Do you have a fever?" If Yes, ask: "What is your temperature, how was it measured, and when did it start?"     Not checked today  7. RESPIRATORY STATUS: "Describe your breathing?" (e.g., normal; shortness of breath, wheezing, unable to speak)      normal 8. BETTER-SAME-WORSE: "Are you getting better, staying the same or getting worse compared to yesterday?"  If getting worse, ask, "In what way?"     Na  9. OTHER SYMPTOMS: "Do you have any other symptoms?"  (e.g., chills, fatigue, headache, loss of smell or taste, muscle pain, sore throat)     Fever, coughing runny nose, body stiffness, chills , dizziness. Fatigue  10. HIGH RISK DISEASE: "Do you have any chronic medical problems?" (e.g., asthma, heart or lung disease, weak immune system, obesity, etc.)       Na  11. VACCINE: "Have you had the COVID-19 vaccine?" If Yes, ask: "Which one, how many shots, when did you get it?"       Yes  12. PREGNANCY: "Is there any chance you are pregnant?" "When was your last menstrual period?"       na 13. O2 SATURATION MONITOR:  "Do  you use an oxygen saturation monitor (pulse oximeter) at home?" If Yes, ask "What is your reading (oxygen level) today?" "What is your usual oxygen saturation reading?" (e.g., 95%)       na  Protocols used: Coronavirus (COVID-19) Diagnosed or Suspected-A-AH

## 2024-02-02 NOTE — Telephone Encounter (Signed)
 Pt declined available opening, she will wait until Monday and see how she feels.

## 2024-02-16 ENCOUNTER — Ambulatory Visit (HOSPITAL_BASED_OUTPATIENT_CLINIC_OR_DEPARTMENT_OTHER): Payer: 59 | Admitting: Orthopaedic Surgery

## 2024-03-06 ENCOUNTER — Ambulatory Visit (HOSPITAL_BASED_OUTPATIENT_CLINIC_OR_DEPARTMENT_OTHER): Admitting: Orthopaedic Surgery

## 2024-04-23 ENCOUNTER — Other Ambulatory Visit: Payer: Self-pay | Admitting: Obstetrics & Gynecology

## 2024-04-23 DIAGNOSIS — Z1231 Encounter for screening mammogram for malignant neoplasm of breast: Secondary | ICD-10-CM

## 2024-04-30 ENCOUNTER — Ambulatory Visit (INDEPENDENT_AMBULATORY_CARE_PROVIDER_SITE_OTHER): Admitting: Family Medicine

## 2024-04-30 ENCOUNTER — Encounter: Payer: Self-pay | Admitting: Family Medicine

## 2024-04-30 VITALS — BP 124/78 | Ht 62.0 in | Wt 135.8 lb

## 2024-04-30 DIAGNOSIS — R5382 Chronic fatigue, unspecified: Secondary | ICD-10-CM | POA: Diagnosis not present

## 2024-04-30 DIAGNOSIS — Z23 Encounter for immunization: Secondary | ICD-10-CM | POA: Diagnosis not present

## 2024-04-30 DIAGNOSIS — Z0000 Encounter for general adult medical examination without abnormal findings: Secondary | ICD-10-CM | POA: Diagnosis not present

## 2024-04-30 DIAGNOSIS — N926 Irregular menstruation, unspecified: Secondary | ICD-10-CM

## 2024-04-30 DIAGNOSIS — E538 Deficiency of other specified B group vitamins: Secondary | ICD-10-CM | POA: Diagnosis not present

## 2024-04-30 DIAGNOSIS — Z1211 Encounter for screening for malignant neoplasm of colon: Secondary | ICD-10-CM

## 2024-04-30 DIAGNOSIS — E559 Vitamin D deficiency, unspecified: Secondary | ICD-10-CM

## 2024-04-30 LAB — CBC WITH DIFFERENTIAL/PLATELET
Basophils Absolute: 0 10*3/uL (ref 0.0–0.1)
Basophils Relative: 0.5 % (ref 0.0–3.0)
Eosinophils Absolute: 0 10*3/uL (ref 0.0–0.7)
Eosinophils Relative: 0.7 % (ref 0.0–5.0)
HCT: 42.5 % (ref 36.0–46.0)
Hemoglobin: 14.2 g/dL (ref 12.0–15.0)
Lymphocytes Relative: 40.7 % (ref 12.0–46.0)
Lymphs Abs: 2.5 10*3/uL (ref 0.7–4.0)
MCHC: 33.4 g/dL (ref 30.0–36.0)
MCV: 88.2 fl (ref 78.0–100.0)
Monocytes Absolute: 0.4 10*3/uL (ref 0.1–1.0)
Monocytes Relative: 6.5 % (ref 3.0–12.0)
Neutro Abs: 3.2 10*3/uL (ref 1.4–7.7)
Neutrophils Relative %: 51.6 % (ref 43.0–77.0)
Platelets: 320 10*3/uL (ref 150.0–400.0)
RBC: 4.82 Mil/uL (ref 3.87–5.11)
RDW: 13.4 % (ref 11.5–15.5)
WBC: 6.2 10*3/uL (ref 4.0–10.5)

## 2024-04-30 LAB — LIPID PANEL
Cholesterol: 229 mg/dL — ABNORMAL HIGH (ref 0–200)
HDL: 59.2 mg/dL (ref >39.00)
LDL Cholesterol: 140 mg/dL — ABNORMAL HIGH (ref 0–99)
NonHDL: 169.63
Total CHOL/HDL Ratio: 4
Triglycerides: 146 mg/dL (ref 0.0–149.0)
VLDL: 29.2 mg/dL (ref 0.0–40.0)

## 2024-04-30 LAB — COMPREHENSIVE METABOLIC PANEL WITH GFR
ALT: 24 U/L (ref 0–35)
AST: 17 U/L (ref 0–37)
Albumin: 4.6 g/dL (ref 3.5–5.2)
Alkaline Phosphatase: 73 U/L (ref 39–117)
BUN: 12 mg/dL (ref 6–23)
CO2: 29 meq/L (ref 19–32)
Calcium: 9.6 mg/dL (ref 8.4–10.5)
Chloride: 105 meq/L (ref 96–112)
Creatinine, Ser: 0.6 mg/dL (ref 0.40–1.20)
GFR: 104.58 mL/min (ref >60.00)
Glucose, Bld: 92 mg/dL (ref 70–99)
Potassium: 4.3 meq/L (ref 3.5–5.1)
Sodium: 140 meq/L (ref 135–145)
Total Bilirubin: 0.4 mg/dL (ref 0.2–1.2)
Total Protein: 7 g/dL (ref 6.0–8.3)

## 2024-04-30 LAB — VITAMIN B12: Vitamin B-12: 1275 pg/mL — ABNORMAL HIGH (ref 211–911)

## 2024-04-30 LAB — HEMOGLOBIN A1C: Hgb A1c MFr Bld: 5.3 % (ref 4.6–6.5)

## 2024-04-30 LAB — VITAMIN D 25 HYDROXY (VIT D DEFICIENCY, FRACTURES): VITD: 64.84 ng/mL (ref 30.00–100.00)

## 2024-04-30 LAB — TSH: TSH: 1.41 u[IU]/mL (ref 0.35–5.50)

## 2024-04-30 NOTE — Patient Instructions (Signed)

## 2024-04-30 NOTE — Progress Notes (Signed)
 Office Note 04/30/2024  CC:  Chief Complaint  Patient presents with   Annual Exam    Pt is fasting   HPI:  Patient is a 53 y.o. female who is here for annual health maintenance exam. For the past 6 months has noted irregular menses, some weight gain, fatigue, and heel pain. Right heel hurts worse than left, medial plantar aspect for the most part but also into the arch.  Worse upon getting out of bed in the morning.  Later in the day she sometimes can walk quite a while and did not have problem.  Prolonged standing exacerbates the symptoms.  Strong family history of diabetes. Unfortunately, her brother died last year of pancreatic cancer.  She is a vegetarian.  She takes over-the-counter iron supplement, multivitamin, B12, and vitamin D .  She takes most of these about every other day.  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   ED visit for vertigo: CT angio neck normal   Vitamin D  deficiency 10/30/2012    Past Surgical History:  Procedure Laterality Date   CESAREAN SECTION  2009 &2002   KNEE ARTHROSCOPY WITH MENISCAL REPAIR Right 02/21/2023   Procedure: RIGHT KNEE ARTHROSCOPY WITH MEDIAL MENISCAL ROOT  REPAIR;  Surgeon: Wilhelmenia Harada, MD;  Location: [facility];  Service: Orthopedics;  Laterality: Right;   Sleep study     10/2022 NO OSA    Family History  Problem Relation Age of Onset   Diabetes Father    Hypertension Father    Hypertension Sister    Diabetes Sister    Hypertension  Mother    Diabetes Mother    Hyperlipidemia Mother    Thyroid  disease Mother    Diabetes Maternal Grandfather    Pancreatic cancer Brother        pancreatic    Social History   Socioeconomic History   Marital status: Married    Spouse name: Not on file   Number of children: Not on file   Years of education: Not on file   Highest education level: Not on file  Occupational History   Occupation: IT  Tobacco Use   Smoking status: Never   Smokeless tobacco: Never  Vaping Use   Vaping status: Never Used  Substance and Sexual Activity   Alcohol use: Yes    Comment: occasional   Drug use: No   Sexual activity: Yes    Partners: Male    Birth control/protection: None  Other Topics Concern   Not on file  Social History Narrative   [place] country of origin        works outside Freight forwarder level education      HH of 4 (51 y/o, 51 y/o + married).         pets no        NO ets or tobacco.         Vegetarian      Smoking Status:  never  Caffeine use/day:  3-4      Does Patient Exercise:  no         Social Drivers of Corporate investment banker Strain: Not on file  Food Insecurity: Not on file  Transportation Needs: Not on file  Physical Activity: Not on file  Stress: Not on file  Social Connections: Not on file  Intimate Partner Violence: Not on file    Outpatient Medications Prior to Visit  Medication Sig Dispense Refill   aspirin  EC 325 MG tablet Take 1 tablet (325 mg total) by mouth daily. 30 tablet 0   calcium  carbonate (OS-CAL) 600 MG TABS tablet Take 1 tablet (600 mg total) by mouth 2 (two) times daily with a meal. 60 tablet 12   cetirizine (ZYRTEC) 10 MG tablet Take 10 mg by mouth daily as needed for allergies.     Multiple Vitamin (MULTIVITAMIN) tablet Take 1 tablet by mouth daily.     Vitamin D , Ergocalciferol , (DRISDOL ) 1.25 MG (50000 UNIT) CAPS capsule TAKE 1 CAPSULE (50,000 UNITS TOTAL) BY MOUTH EVERY 7 (SEVEN) DAYS 4 capsule 0   vitamin  E 180 MG (400 UNITS) capsule Take 1 capsule (400 Units total) by mouth 2 (two) times daily with a meal. 60 capsule 12   oxyCODONE  (ROXICODONE ) 5 MG immediate release tablet Take 1 tablet (5 mg total) by mouth every 4 (four) hours as needed for severe pain or breakthrough pain. (Patient not taking: Reported on 04/30/2024) 10 tablet 0   No facility-administered medications prior to visit.    Allergies  Allergen Reactions   Other Anxiety, Palpitations and Other (See Comments)    SEPTOCAINE w/ EPI  Used during a dental procedure- caused dizziness/ lightheaded/ heart pounding and anxiety    Review of Systems  Constitutional:  Negative for appetite change, chills, fatigue and fever.  HENT:  Negative for congestion, dental problem, ear pain and sore throat.   Eyes:  Negative for discharge, redness and visual disturbance.  Respiratory:  Negative for cough, chest tightness, shortness of breath and wheezing.   Cardiovascular:  Negative for chest pain, palpitations and leg swelling.  Gastrointestinal:  Negative for abdominal pain, blood in stool, diarrhea, nausea and vomiting.  Genitourinary:  Negative for difficulty urinating, dysuria, flank pain, frequency, hematuria and urgency.  Musculoskeletal:  Negative for arthralgias, joint swelling, myalgias and neck stiffness.  Skin:  Negative for pallor and rash.  Neurological:  Negative for dizziness, speech difficulty, weakness and headaches.  Hematological:  Negative for adenopathy. Does not bruise/bleed easily.  Psychiatric/Behavioral:  Negative for confusion and sleep disturbance. The patient is not nervous/anxious.     PE;    04/30/2024   10:04 AM 02/21/2023   10:26 AM 02/21/2023   10:00 AM  Vitals with BMI  Height 5\' 2"     Weight 135 lbs 13 oz    BMI 24.83    Systolic 124 114 478  Diastolic 78 65 68  Pulse  55 69   Exam chaperoned by Terris Fickle, CMA. Gen: Alert, well appearing.  Patient is oriented to person, place, time, and  situation. AFFECT: pleasant, lucid thought and speech. ENT: Ears: EACs clear, normal epithelium.  TMs with good light reflex and landmarks bilaterally.  Eyes: no injection, icteris, swelling, or exudate.  EOMI, PERRLA. Nose: no drainage or turbinate edema/swelling.  No injection or focal lesion.  Mouth: lips without lesion/swelling.  Oral mucosa pink and moist.  Dentition intact and without obvious caries or gingival swelling.  Oropharynx without erythema,  exudate, or swelling.  Neck: supple/nontender.  No LAD, mass, or TM.  Carotid pulses 2+ bilaterally, without bruits. CV: RRR, no m/r/g.   LUNGS: CTA bilat, nonlabored resps, good aeration in all lung fields. ABD: soft, NT, ND, BS normal.  No hepatospenomegaly or mass.  No bruits. EXT: no clubbing, cyanosis, or edema.  Feet: She has significant tenderness to palpation over the medial calcaneal tubercle bilateral.  A little discomfort with palpating the plantar surface as it extends towards the distal metatarsals. Ankle range of motion fully intact and without pain. Musculoskeletal: no joint swelling, erythema, warmth, or tenderness.  ROM of all joints intact. Skin - no sores or suspicious lesions or rashes or color changes  Pertinent labs:  Lab Results  Component Value Date   TSH 2.54 01/12/2022   Lab Results  Component Value Date   WBC 6.6 01/12/2022   HGB 13.2 01/12/2022   HCT 40.5 01/12/2022   MCV 90.6 01/12/2022   PLT 353 01/12/2022   Lab Results  Component Value Date   IRON 170 05/09/2022   TIBC 309 05/09/2022   FERRITIN 50 05/09/2022   Lab Results  Component Value Date   CREATININE 0.81 01/12/2022   BUN 15 01/12/2022   NA 138 01/12/2022   K 4.2 01/12/2022   CL 104 01/12/2022   CO2 28 01/12/2022   Lab Results  Component Value Date   ALT 20 01/12/2022   AST 17 01/12/2022   ALKPHOS 71 01/27/2020   BILITOT 0.5 01/12/2022   Lab Results  Component Value Date   CHOL 199 01/12/2022   Lab Results  Component Value  Date   HDL 59 01/12/2022   Lab Results  Component Value Date   LDLCALC 117 (H) 01/12/2022   Lab Results  Component Value Date   TRIG 120 01/12/2022   Lab Results  Component Value Date   CHOLHDL 3.4 01/12/2022   Lab Results  Component Value Date   HGBA1C 5.1 01/12/2022  Last vitamin D  Lab Results  Component Value Date   VD25OH 45.91 05/09/2022   Lab Results  Component Value Date   VITAMINB12 781 05/09/2022   ASSESSMENT AND PLAN:   #1 health maintenance exam: Reviewed age and gender appropriate health maintenance issues (prudent diet, regular exercise, health risks of tobacco and excessive alcohol, use of seatbelts, fire alarms in home, use of sunscreen).  Also reviewed age and gender appropriate health screening as well as vaccine recommendations. Vaccines: Shingrix-->#1 today.   Tdap-->booster today. Labs: fasting HP + vit D level ordered (vit D def). Cervical ca screening: pap normal/neg HPV on 01/11/22.  She'll arrange f/u with her GYN. Breast ca screening: Screening mammogram - 01/19/2023.  She is scheduled for mammogram 05/08/2024. Colon ca screening: average risk patient= as per latest guidelines it is recommended she start screening now-->options discussed-->cologuard ordered.  #2 fatigue. She does have a history of a waxing and waning chronic level of this but it seems worse lately. Of note blood workups in the past have been largely unrevealing and sleep study was negative for sleep apnea. Monitor TSH, glucose, hemoglobin A1c, metabolic panel, complete blood count, vitamin B12, and vitamin D  level.  #3 bilateral plantar fasciitis, right greater than left. Discussed appropriate activity modification, home stretching/strengthening, appropriate footwear/inserts. Discussed possible option of steroid injection if severe and prolonged symptoms.  # 4 perimenopausal. Reassured. She will get appropriate routine annual follow-up with her GYN MD soon.  An After Visit  Summary was printed and given to the  patient.  FOLLOW UP:  Return in about 1 year (around 04/30/2025) for annual CPE (fasting), schedule a nurse visit in 2-6 months for shingrix #2.  Signed:  Arletha Lady, MD           04/30/2024

## 2024-05-07 ENCOUNTER — Telehealth: Payer: Self-pay

## 2024-05-07 NOTE — Telephone Encounter (Signed)
 Results not received from provider at this time

## 2024-05-07 NOTE — Telephone Encounter (Signed)
 Copied from CRM [id number]. Topic: Clinical - Lab/Test Results >> May 07, 2024  2:09 PM Marlan Silva wrote: Reason for CRM: Patient states she saw two abnormal labs on her mychart and she would like to go over them she can be reached at [phone].

## 2024-05-08 ENCOUNTER — Ambulatory Visit

## 2024-05-08 ENCOUNTER — Ambulatory Visit: Payer: Self-pay | Admitting: Family Medicine

## 2024-05-08 DIAGNOSIS — Z1231 Encounter for screening mammogram for malignant neoplasm of breast: Secondary | ICD-10-CM

## 2024-05-08 NOTE — Telephone Encounter (Signed)
 Result note sent to patient today.

## 2024-05-08 NOTE — Telephone Encounter (Signed)
 No further action needed.

## 2024-05-09 ENCOUNTER — Other Ambulatory Visit: Payer: Self-pay | Admitting: Family Medicine

## 2024-05-09 DIAGNOSIS — R7989 Other specified abnormal findings of blood chemistry: Secondary | ICD-10-CM

## 2024-05-25 LAB — COLOGUARD: COLOGUARD: NEGATIVE

## 2024-06-28 ENCOUNTER — Other Ambulatory Visit (INDEPENDENT_AMBULATORY_CARE_PROVIDER_SITE_OTHER)

## 2024-06-28 DIAGNOSIS — R7989 Other specified abnormal findings of blood chemistry: Secondary | ICD-10-CM | POA: Diagnosis not present

## 2024-06-28 LAB — VITAMIN B12: Vitamin B-12: 1096 pg/mL — ABNORMAL HIGH (ref 211–911)

## 2024-07-01 ENCOUNTER — Ambulatory Visit: Payer: Self-pay | Admitting: Family Medicine

## 2024-08-07 ENCOUNTER — Encounter: Payer: Self-pay | Admitting: Family Medicine

## 2024-08-07 MED ORDER — SCOPOLAMINE 1 MG/3DAYS TD PT72: 1.0000 | MEDICATED_PATCH | TRANSDERMAL | 1 refills | Status: DC

## 2024-08-07 NOTE — Telephone Encounter (Signed)
 No appointment needed. Scopolamine  patch sent

## 2024-09-27 ENCOUNTER — Ambulatory Visit (INDEPENDENT_AMBULATORY_CARE_PROVIDER_SITE_OTHER)

## 2024-09-27 DIAGNOSIS — Z23 Encounter for immunization: Secondary | ICD-10-CM | POA: Diagnosis not present

## 2024-09-30 ENCOUNTER — Encounter: Payer: Self-pay | Admitting: Radiology

## 2024-11-04 ENCOUNTER — Ambulatory Visit: Admitting: Obstetrics & Gynecology

## 2024-12-09 ENCOUNTER — Encounter: Payer: Self-pay | Admitting: Obstetrics & Gynecology

## 2024-12-09 ENCOUNTER — Other Ambulatory Visit (HOSPITAL_COMMUNITY)
Admission: RE | Admit: 2024-12-09 | Discharge: 2024-12-09 | Disposition: A | Source: Ambulatory Visit | Attending: Obstetrics & Gynecology | Admitting: Obstetrics & Gynecology

## 2024-12-09 ENCOUNTER — Ambulatory Visit (INDEPENDENT_AMBULATORY_CARE_PROVIDER_SITE_OTHER): Admitting: Obstetrics & Gynecology

## 2024-12-09 VITALS — BP 115/74 | HR 62 | Ht 63.0 in | Wt 135.1 lb

## 2024-12-09 DIAGNOSIS — Z0000 Encounter for general adult medical examination without abnormal findings: Secondary | ICD-10-CM

## 2024-12-09 DIAGNOSIS — D509 Iron deficiency anemia, unspecified: Secondary | ICD-10-CM

## 2024-12-09 DIAGNOSIS — Z01419 Encounter for gynecological examination (general) (routine) without abnormal findings: Secondary | ICD-10-CM

## 2024-12-09 DIAGNOSIS — R7989 Other specified abnormal findings of blood chemistry: Secondary | ICD-10-CM

## 2024-12-09 NOTE — Progress Notes (Signed)
" ° °  Subjective:     Katie Beck is a 54 y.o. female here for a routine exam.  Current complaints: periods being more irregular.  Comes about every 2 months for the past 8-9 months.  First 2 days are pretty heavy (3 pads a day when heavy) and then spotting for several days.  Periods are associated with breast tenderness. Exercise 4 days a week-walks 2 miles each time.     Gynecologic History No LMP recorded. Patient is perimenopausal. Contraception: condoms Last pap smear (date and result):01/10/22 NILM and HPV negative Last mammogram (date and result):05/08/24 [facility] 1 Last colon screening (date and result):Cologuard 05/21/24 negative Brush:yes Floss:yes Seatbelts: yes Sunscreen: yes   Obstetric History OB History  Gravida Para Term Preterm AB Living  2 2 2      SAB IAB Ectopic Multiple Live Births          # Outcome Date GA Lbr Len/2nd Weight Sex Type Anes PTL Lv  2 Term      CS-LTranv     1 Term      CS-LTranv        The following portions of the patient's history were reviewed and updated as appropriate: allergies, current medications, past family history, past medical history, past social history, past surgical history, and problem list.  Review of Systems Pertinent items noted in HPI and remainder of comprehensive ROS otherwise negative.    Objective:     Vitals:   12/09/24 1112  BP: 115/74  Pulse: 62  Weight: 135 lb 1.3 oz (61.3 kg)  Height: 5' 3 (1.6 m)   Vitals:  WNL General appearance: alert, cooperative and no distress  HEENT: Normocephalic, without obvious abnormality, atraumatic Eyes: negative Throat: lips, mucosa, and tongue normal; teeth and gums normal  Respiratory: Clear to auscultation bilaterally  CV: Regular rate and rhythm  Breasts:  Normal appearance, no masses or tenderness, no nipple retraction or dimpling  GI: Soft, non-tender; bowel sounds normal; no masses,  no organomegaly  GU: External Genitalia:  Tanner V, no lesion Urethra:  No  prolapse   Vagina: Pink, normal rugae, no blood or discharge  Cervix: No CMT, no lesion  Uterus:  Normal size and contour, non tender  Adnexa: Normal, no masses, non tender  Musculoskeletal: No edema, redness or tenderness in the calves or thighs  Skin: No lesions or rash  Lymphatic: Axillary adenopathy: none     Psychiatric: Normal mood and behavior        Assessment:    Healthy female exam.    Plan:    Pap up to date Colon cancer screening up to date. Sleep--sleeping about every 5 hours; sometimes hot flash and sometimes need to urinate; sleep hygiene reviewed (avoid ipad and phone in the middle of the night).  Limit water intake near bedtime Magnesium glyconate for sleep; follow up PCP if further sleep work up needed.   Maximize calcium , vitamin D , and weight bearing exercise.  Pt given handout on calcium  intake to calculate daily intake 1200 mg per day.  Pt to keep detailed menstrual diary on phone so that we can review cycles and bleeding if concerns arise.   Yearly mammograms. Hx or iron deficiency anemia and B12 deficiency--recently stopped B12 and iron; would like to see her cbc and B12 levels.  Ordered today.  "

## 2024-12-10 ENCOUNTER — Ambulatory Visit: Payer: Self-pay | Admitting: Obstetrics & Gynecology

## 2024-12-10 ENCOUNTER — Encounter: Payer: Self-pay | Admitting: Family Medicine

## 2024-12-10 LAB — CBC
Hematocrit: 42.4 % (ref 34.0–46.6)
Hemoglobin: 13.9 g/dL (ref 11.1–15.9)
MCH: 29.8 pg (ref 26.6–33.0)
MCHC: 32.8 g/dL (ref 31.5–35.7)
MCV: 91 fL (ref 79–97)
Platelets: 364 x10E3/uL (ref 150–450)
RBC: 4.66 x10E6/uL (ref 3.77–5.28)
RDW: 13.1 % (ref 11.7–15.4)
WBC: 7.8 x10E3/uL (ref 3.4–10.8)

## 2024-12-10 LAB — VITAMIN B12: Vitamin B-12: >2000 pg/mL — ABNORMAL HIGH (ref 232–1245)

## 2024-12-12 LAB — CYTOLOGY - PAP
Comment: NEGATIVE
Diagnosis: NEGATIVE
Diagnosis: REACTIVE
High risk HPV: NEGATIVE

## 2024-12-27 ENCOUNTER — Ambulatory Visit: Admitting: Family Medicine

## 2024-12-27 ENCOUNTER — Encounter: Payer: Self-pay | Admitting: Family Medicine

## 2024-12-27 VITALS — BP 127/78 | HR 68 | Temp 98.7°F | Ht 63.0 in | Wt 143.4 lb

## 2024-12-27 DIAGNOSIS — J111 Influenza due to unidentified influenza virus with other respiratory manifestations: Secondary | ICD-10-CM | POA: Diagnosis not present

## 2024-12-27 DIAGNOSIS — R5383 Other fatigue: Secondary | ICD-10-CM | POA: Diagnosis not present

## 2024-12-27 DIAGNOSIS — J029 Acute pharyngitis, unspecified: Secondary | ICD-10-CM

## 2024-12-27 DIAGNOSIS — R6883 Chills (without fever): Secondary | ICD-10-CM

## 2024-12-27 LAB — POC COVID19/FLU A&B COMBO
Covid Antigen, POC: NEGATIVE
Influenza A Antigen, POC: NEGATIVE
Influenza B Antigen, POC: NEGATIVE

## 2024-12-27 MED ORDER — OSELTAMIVIR PHOSPHATE 75 MG PO CAPS: 75.0000 mg | ORAL_CAPSULE | Freq: Two times a day (BID) | ORAL | 0 refills | Status: AC

## 2024-12-27 NOTE — Progress Notes (Signed)
 OFFICE VISIT  12/27/2024  CC:  Chief Complaint  Patient presents with   Sore Throat    Pt's son tested flu positive yesterday; she is having fatigue, chills and sore throat, symptoms started yesterday    Patient is a 54 y.o. female who presents for sore throat.  HPI: Onset yesterday of sore throat, headache, slight cough and slight fatigue. No known fever, no body aches.  She is eating and drinking fine. She took Tylenol  yesterday for her throat. No shortness of breath, wheezing, or chest pain.  Of note, her son was diagnosed with influenza by positive swab yesterday.  Past Medical History:  Diagnosis Date   Anemia 10/30/2012   Colon cancer screening    04/2024 Cologuard NEG   Complication of anesthesia    Reaction to Septocaine w/ epi during dental procedure in office- lightheaded, palpitations/anxiety   CTS (carpal tunnel syndrome)    History of meniscal tear    R knee 2023   PCO (polycystic ovaries)    conceived on metformin   Unilateral primary osteoarthritis, right knee    Vestibular neuronitis 07/27/2014   ED visit for vertigo: CT angio neck normal   Vitamin D  deficiency 10/30/2012    Past Surgical History:  Procedure Laterality Date   CESAREAN SECTION  2009 &2002   KNEE ARTHROSCOPY WITH MENISCAL REPAIR Right 02/21/2023   Procedure: RIGHT KNEE ARTHROSCOPY WITH MEDIAL MENISCAL ROOT  REPAIR;  Surgeon: Genelle Standing, MD;  Location: [facility];  Service: Orthopedics;  Laterality: Right;   Sleep study     10/2022 NO OSA    Outpatient Medications Prior to Visit  Medication Sig Dispense Refill   calcium  carbonate (OS-CAL) 600 MG TABS tablet Take 1 tablet (600 mg total) by mouth 2 (two) times daily with a meal. 60 tablet 12   cetirizine (ZYRTEC) 10 MG tablet Take 10 mg by mouth daily as needed for allergies.     Multiple Vitamin (MULTIVITAMIN) tablet Take 1 tablet by mouth daily.     Vitamin D , Ergocalciferol , (DRISDOL ) 1.25 MG (50000 UNIT) CAPS  capsule TAKE 1 CAPSULE (50,000 UNITS TOTAL) BY MOUTH EVERY 7 (SEVEN) DAYS 4 capsule 0   aspirin  EC 325 MG tablet Take 1 tablet (325 mg total) by mouth daily. (Patient not taking: Reported on 12/27/2024) 30 tablet 0   scopolamine  (TRANSDERM-SCOP) 1 MG/3DAYS Place 1 patch (1 mg total) onto the skin every 3 (three) days. (Patient not taking: Reported on 12/09/2024) 4 patch 1   vitamin E  180 MG (400 UNITS) capsule Take 1 capsule (400 Units total) by mouth 2 (two) times daily with a meal. (Patient not taking: Reported on 12/27/2024) 60 capsule 12   No facility-administered medications prior to visit.    Allergies[1]  Review of Systems  As per HPI  PE:    12/27/2024    8:39 AM 12/09/2024   11:12 AM 04/30/2024   10:04 AM  Vitals with BMI  Height 5' 3 5' 3 5' 2  Weight 143 lbs 6 oz 135 lbs 1 oz 135 lbs 13 oz  BMI 25.41 23.93 24.83  Systolic 127 115 875  Diastolic 78 74 78  Pulse 68 62      Physical Exam  Gen: Alert, well appearing.  Patient is oriented to person, place, time, and situation. AFFECT: pleasant, lucid thought and speech. ZWU:Zbzd: no injection, icteris, swelling, or exudate.  EOMI, PERRLA. Mouth: lips without lesion/swelling.  Oral mucosa pink and moist. Oropharynx without erythema, exudate, or swelling.  CV: RRR, no m/r/g.   LUNGS: CTA bilat, nonlabored resps, good aeration in all lung fields.  LABS:  Last metabolic panel Lab Results  Component Value Date   GLUCOSE 92 04/30/2024   NA 140 04/30/2024   K 4.3 04/30/2024   CL 105 04/30/2024   CO2 29 04/30/2024   BUN 12 04/30/2024   CREATININE 0.60 04/30/2024   GFR 104.58 04/30/2024   CALCIUM  9.6 04/30/2024   PROT 7.0 04/30/2024   ALBUMIN 4.6 04/30/2024   BILITOT 0.4 04/30/2024   ALKPHOS 73 04/30/2024   AST 17 04/30/2024   ALT 24 04/30/2024   ANIONGAP 14 07/27/2014  Last vitamin D  Lab Results  Component Value Date   VD25OH 64.84 04/30/2024   Rapid flu and COVID test here today are negative.  IMPRESSION  AND PLAN:  Influenza.  She is within the 48-hour antiviral treatment window. Tamiflu  75 mg twice daily x 5 days prescribed today. Over-the-counter symptomatic care discussed.  An After Visit Summary was printed and given to the patient.  FOLLOW UP: Return if symptoms worsen or fail to improve.  Signed:  Gerlene Hockey, MD           12/27/2024      [1]  Allergies Allergen Reactions   Other Anxiety, Palpitations and Other (See Comments)    SEPTOCAINE w/ EPI  Used during a dental procedure- caused dizziness/ lightheaded/ heart pounding and anxiety

## 2025-05-01 ENCOUNTER — Encounter: Admitting: Family Medicine
